# Patient Record
Sex: Male | Born: 1965 | Race: White | Hispanic: No | Marital: Married | Smoking: Former smoker
Health system: Southern US, Community
[De-identification: ages and names within clinical notes are randomized; demographics above are authoritative.]

## PROBLEM LIST (undated history)

## (undated) DIAGNOSIS — I10 Essential (primary) hypertension: Secondary | ICD-10-CM

## (undated) DIAGNOSIS — F419 Anxiety disorder, unspecified: Secondary | ICD-10-CM

## (undated) DIAGNOSIS — E785 Hyperlipidemia, unspecified: Secondary | ICD-10-CM

## (undated) HISTORY — DX: Essential (primary) hypertension: I10

## (undated) HISTORY — DX: Anxiety disorder, unspecified: F41.9

## (undated) HISTORY — DX: Hyperlipidemia, unspecified: E78.5

---
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
## 1997-12-08 HISTORY — PX: VASECTOMY: SHX75

## 2006-02-09 ENCOUNTER — Emergency Department (HOSPITAL_COMMUNITY): Admission: EM | Admit: 2006-02-09 | Discharge: 2006-02-09 | Payer: Self-pay | Admitting: Emergency Medicine

## 2011-11-12 ENCOUNTER — Ambulatory Visit: Payer: PRIVATE HEALTH INSURANCE

## 2011-11-13 ENCOUNTER — Ambulatory Visit (INDEPENDENT_AMBULATORY_CARE_PROVIDER_SITE_OTHER): Payer: PRIVATE HEALTH INSURANCE

## 2011-11-13 DIAGNOSIS — J00 Acute nasopharyngitis [common cold]: Secondary | ICD-10-CM

## 2011-11-13 DIAGNOSIS — J40 Bronchitis, not specified as acute or chronic: Secondary | ICD-10-CM

## 2011-11-13 DIAGNOSIS — H6980 Other specified disorders of Eustachian tube, unspecified ear: Secondary | ICD-10-CM

## 2011-11-13 DIAGNOSIS — J019 Acute sinusitis, unspecified: Secondary | ICD-10-CM

## 2018-06-22 ENCOUNTER — Ambulatory Visit: Payer: BLUE CROSS/BLUE SHIELD | Admitting: Family Medicine

## 2018-06-22 ENCOUNTER — Encounter: Payer: Self-pay | Admitting: Family Medicine

## 2018-06-22 VITALS — BP 154/108 | HR 92 | Ht 73.0 in | Wt 227.8 lb

## 2018-06-22 DIAGNOSIS — Z8042 Family history of malignant neoplasm of prostate: Secondary | ICD-10-CM

## 2018-06-22 DIAGNOSIS — Z833 Family history of diabetes mellitus: Secondary | ICD-10-CM | POA: Diagnosis not present

## 2018-06-22 DIAGNOSIS — Z811 Family history of alcohol abuse and dependence: Secondary | ICD-10-CM

## 2018-06-22 DIAGNOSIS — R030 Elevated blood-pressure reading, without diagnosis of hypertension: Secondary | ICD-10-CM | POA: Insufficient documentation

## 2018-06-22 DIAGNOSIS — Z83438 Family history of other disorder of lipoprotein metabolism and other lipidemia: Secondary | ICD-10-CM | POA: Diagnosis not present

## 2018-06-22 DIAGNOSIS — Z8249 Family history of ischemic heart disease and other diseases of the circulatory system: Secondary | ICD-10-CM | POA: Diagnosis not present

## 2018-06-22 DIAGNOSIS — F1010 Alcohol abuse, uncomplicated: Secondary | ICD-10-CM | POA: Diagnosis not present

## 2018-06-22 DIAGNOSIS — Z87891 Personal history of nicotine dependence: Secondary | ICD-10-CM | POA: Diagnosis not present

## 2018-06-22 NOTE — Progress Notes (Signed)
New patient office visit note:  Impression and Recommendations:    1. Elevated blood pressure reading   2. Excessive drinking of alcohol   3. History of smoking 25-50 pack years   4. Family history of diabetes mellitus   5. Family history of essential hypertension   6. Family history of prostate cancer in father   83. Family history of alcoholism   8. Family history of combined hyperlipidemia     1. Elevated Blood Pressure - Reviewed normal blood pressure with the patient today, 120/80.  - Lifestyle changes such as dash diet and engaging in a regular exercise program discussed with patient.  Reduce alcohol intake to help control blood pressure as well.  - Ambulatory BP monitoring encouraged. Keep log and bring in next OV.  Sit quietly for 15-20 minutes prior to measuring blood pressure, avoiding stimulants beforehand.  2. BMI Counseling Explained to patient what BMI refers to, and what it means medically.    Told patient to think about it as a "medical risk stratification measurement" and how increasing BMI is associated with increasing risk/ or worsening state of various diseases such as hypertension, hyperlipidemia, diabetes, premature OA, depression etc.  American Heart Association guidelines for healthy diet, basically Mediterranean diet, and exercise guidelines of 30 minutes 5 days per week or more discussed in detail.  Health counseling performed.  All questions answered.  3. Lifestyle & Preventative Health Maintenance - Advised patient to continue working toward exercising to improve overall mental, physical, and emotional health.    - Engage in daily physical activity, to goal of 5-10 minutes per day to start.  Recommended that the patient eventually strive for at least 150 minutes of moderate cardiovascular activity per week according to guidelines established by the [facility].   - Healthy dietary habits encouraged, including low-carb, and high amounts of lean protein in  diet.   - Patient should also consume adequate amounts of water - half of body weight in oz of water per day.   Education and routine counseling performed. Handouts provided.  4. Follow-Up - Will draw lab work at patient's earliest convenience.  Return for follow-up OV to discuss labs and blood pressure.  - Discussed the need for CPE in the future.  - Reviewed red flag symptoms with the patient.  If he feels chest pain, dizziness, dizziness on exertion, new onset visual changes, new onset headaches, chest tightness, chest tightness on exertion, or arm pain or jaw pain, he knows to immediately seek emergency medical care.   Orders Placed This Encounter  Procedures  . CBC with Differential/Platelet  . Comprehensive metabolic panel  . Hemoglobin A1c  . Lipid panel  . HIV antibody  . Hepatitis C antibody  . PSA, total and free  . T4, free  . TSH  . VITAMIN D 25 Hydroxy (Vit-D Deficiency, Fractures)  . Microalbumin / creatinine urine ratio    Gross side effects, risk and benefits, and alternatives of medications discussed with patient.  Patient is aware that all medications have potential side effects and we are unable to predict every side effect or drug-drug interaction that may occur.  Expresses verbal understanding and consents to current therapy plan and treatment regimen.  Return for Chronic OV w me near future & FBW 2-3d prior.  Please see AVS handed out to patient at the end of our visit for further patient instructions/ counseling done pertaining to today's office visit.    Note: This document was prepared  using Systems analyst and may include unintentional dictation errors.  This document serves as a record of services personally performed by [name], DO. It was created on her behalf by Toni Amend, a trained medical scribe. The creation of this record is based on the scribe's personal observations and the provider's statements to them.    I have reviewed the above medical documentation for accuracy and completeness and I concur.  [name] 06/23/18 6:55 PM    ----------------------------------------------------------------------------------------------------------------------    Subjective:    Chief complaint:   Chief Complaint  Patient presents with  . Establish Care    HPI: Patrick Avila is a pleasant 52 y.o. male who presents to [facility] today to review their medical history with me and establish care.   I asked the patient to review their chronic problem list with me to ensure everything was updated and accurate.    All recent office visits with other providers, any medical records that patient brought in etc  - I reviewed today.     We asked pt to get [place] their medical records from [facility] providers/ specialists that they had seen within the past 3-5 years- if they are in private practice and/or do not work for [employer], [facility], [place], [facility] or [employer] owned practice.  Told them to call their specialists to clarify this if they are not sure.   Reason for Establishing Care Patient states that he's never had a medical provider before.  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 He's turning 52 this year.  When he hit 20, he got motivated, dedicated, worked out, go Research officer, trade union, lasted for 3 to 3.5 years.  He would always give up on his birthday [birth year] until New Year's, and then resume.  Then one year it didn't happen.    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  Would like to get back in shape and maybe maintain it a few more years longer.  Went totally lazy over the winter; sat on the couch, didn't move, drank beer. Drinks Bud Light.  States that  his mom has been fussing at him to come in for five years.  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  Was formerly married to one person; now married to second wife, Gabriel Cirri.  Tobacco Use Former smoker. Smoked for 15 years, at a pack and a half per day at the worst. Has been smoking marijuana prior to and after.  Alcohol Use Drinks large amounts of beer per week; prefers Bud Light. Has been a fairly consistent heavier drinker for 6 years.  History of daily marijuana use; does not use it daily now.  Family History Father, maternal grandparents, and several uncles that abused alcohol.  Mom had kidney cancer.  Mother has been on antidepressants "forever."  Mom and Dad with HTN and DM.  Mom was bigger, had gastric bypass 10-15 years ago. Had diabetes at age 43, probably.  Dad didn't get his diabetes figured out until age 19.  Dad was a smoker; with COPD.  Dad was diagnosed with prostate cancer in low or mid 36's.  Has a half-sister on  ADHD medicine.  Past Medical History  Deals with a lot of chronic stress.  Patient expresses that he does not want an anxiety or mood disorder diagnosis on his record.  Has high blood pressure; was "yelled at by the lady at [facility]" last year to go get a primary care provider.  At home, runs high 130's, "sometimes down."  States sometimes he will be in the high 120's, every now and then it's really high. Lower number is always in the 90's.  Feeling Winded - Potentially due to Inactivity He is here today due to the fact that he wanted to get back into shape and had the goal of starting to work out again more often.  But since he was feeling so winded, "I didn't want my wife coming back home and finding me dead."  Denies chest pain.  Denies dizziness, denies new onset visual changes.  Denies headaches.  Denies chest tightness.  Denies chest tightness on exertion.   Denies arm pain or jaw pain.  Has started wearing readers in the past two years.  When he's drinking less, his vision is better; when he drinks more, his vision is worse.  Notes that he excessively sweats all the time.   Wt Readings from Last 3 Encounters:  06/22/18 227 lb 12.8 oz (103.3 kg)   BP Readings from Last 3 Encounters:  06/22/18 (!) 154/108   Pulse Readings from Last 3 Encounters:  06/22/18 92   BMI Readings from Last 3 Encounters:  06/22/18 30.05 kg/m    Patient Care Team    Relationship Specialty Notifications Start End  [name], DO PCP - General Family Medicine  05/12/18     Patient Active Problem List   Diagnosis Date Noted  . Elevated blood pressure reading 06/22/2018  . Excessive drinking of alcohol 06/22/2018  . History of smoking 25-50 pack years 06/22/2018  . Family history of diabetes mellitus 06/22/2018  . Family history of essential hypertension 06/22/2018  . Family history of prostate cancer in father 06/22/2018  . Family history of alcoholism 06/22/2018  . Family history of combined hyperlipidemia 06/22/2018     History reviewed. No pertinent past medical history.   History reviewed. No pertinent past medical history.      Family History  Problem Relation Age of Onset  . Kidney cancer Mother   . Diabetes Mother   . Hypertension Mother   . Alcoholism Father   . Prostate cancer Father   . Diabetes Father   . Hypertension Father   . COPD Father   . Alcoholism Maternal Grandmother   . Diabetes Maternal Grandmother   . Alcoholism Maternal Grandfather   . Diabetes Maternal Grandfather      Social History   Substance and Sexual Activity  Drug Use Not on file     Social History   Substance and Sexual Activity  Alcohol Use Yes  . Alcohol/week: 18.0 oz  . Types: 30 Standard drinks or equivalent per week     Social History   Tobacco Use  Smoking Status Former Smoker  . Packs/day: 1.50  . Years: 15.00  . Pack  years: 22.50  . Types: Cigarettes  . Last attempt to quit: 2006  . Years since quitting: 13.5  Smokeless Tobacco Current User  . Types: Chew  Tobacco Comment   still smokes cigars occ.      Current Meds  Medication Sig  . Cinnamon 500 MG capsule Take 1,000 mg by mouth daily.  . fluticasone (  FLONASE) 50 MCG/ACT nasal spray Place into both nostrils daily.  Marland Kitchen loratadine (KLS ALLERCLEAR) 10 MG tablet Take 10 mg by mouth daily.  . Milk Thistle 175 MG CAPS Take 1 capsule by mouth daily.  . Multiple Vitamin (ONE-A-DAY MENS PO) Take 1 tablet by mouth daily.    Allergies: Nylon   Review of Systems  Constitutional: Negative for chills, diaphoresis, fever, malaise/fatigue and weight loss.  HENT: Negative for congestion, sore throat and tinnitus.   Eyes: Negative for blurred vision, double vision and photophobia.  Respiratory: Negative for cough and wheezing.   Cardiovascular: Negative for chest pain and palpitations.  Gastrointestinal: Negative for blood in stool, diarrhea, nausea and vomiting.  Genitourinary: Negative for dysuria, frequency and urgency.  Musculoskeletal: Negative for joint pain and myalgias.  Skin: Negative for itching and rash.  Neurological: Negative for dizziness, focal weakness, weakness and headaches.  Endo/Heme/Allergies: Negative for environmental allergies and polydipsia. Does not bruise/bleed easily.  Psychiatric/Behavioral: Negative for depression and memory loss. The patient is not nervous/anxious and does not have insomnia.      Objective:   Blood pressure (!) 154/108, pulse 92, height 6\' 1"  (1.854 m), weight 227 lb 12.8 oz (103.3 kg), SpO2 98 %. Body mass index is 30.05 kg/m. General: Well Developed, well nourished, and in no acute distress.  Neuro: Alert and oriented x3, extra-ocular muscles intact, sensation grossly intact.  HEENT:[place]/AT, PERRLA, neck supple, No carotid bruits Skin: no gross rashes  Cardiac: Regular rate and rhythm Respiratory:  Essentially clear to auscultation bilaterally. Not using accessory muscles, speaking in full sentences.  Abdominal: not grossly distended Musculoskeletal: Ambulates w/o diff, FROM * 4 ext.  Vasc: less 2 sec cap RF, warm and pink  Psych:  No HI/SI, judgement and insight good, Euthymic mood. Full Affect.    No results found for this or any previous visit (from the past 2160 hour(s)).

## 2018-06-22 NOTE — Patient Instructions (Addendum)
Please Google search reasons for falsely elevated PSA levels.  Avoid these things before PSA is checked.    How to Increase Your Level of Physical Activity  Getting regular physical activity is important for your overall health and well-being. Most people do not get enough exercise. There are easy ways to increase your level of physical activity, even if you have not been very active in the past or you are just starting out. Why is physical activity important? Physical activity has many short-term and long-term health benefits. Regular exercise can:  Help you lose weight or maintain a healthy weight.  Strengthen your muscles and bones.  Boost your mood and improve self-esteem.  Reduce your risk of certain long-term (chronic) diseases, like heart disease, cancer, and diabetes.  Help you stay capable of walking and moving around (mobile) as you age.  Prevent accidents, such as falls, as you age.  Increase life expectancy.  What are the benefits of being physically active on a regular basis? In addition to improving your physical health, being physically active on most days of the week can help you in ways that you may not expect. Benefits of regular physical activity may include:  Feeling good about your body.  Being able to move around more easily and for longer periods of time without getting tired (increased stamina).  Finding new sources of fun and enjoyment.  Meeting new people who share a common interest.  Being able to fight off illness better (enhanced immunity).  Being able to sleep better.  What can happen if I am not physically active on a regular basis? Not getting enough physical activity can lead to an unhealthy lifestyle and future health problems. This can increase your chances of:  Becoming overweight or obese.  Becoming sick.  Developing chronic illnesses, like heart disease or diabetes.  Having mental health problems, like depression or  anxiety.  Having sleep problems.  Having trouble walking or getting yourself around (reduced mobility).  Injuring yourself in a fall as you get older.  What steps can I take to be more physically active?  Check with your health care provider about how to get started. Ask your health care provider what activities are safe for you.  Start out slowly. Walking or doing some simple chair exercises is a good place to start, especially if you have not been active before or for a long time.  Try to find activities that you enjoy. You are more likely to commit to an exercise routine if it does not feel like a chore.  If you have bone or joint problems, choose low-impact exercises, like walking or swimming.  Include physical activity in your everyday routine.  Invite friends or family members to exercise with you. This also will help you commit to your workout plan.  Set goals that you can work toward.  Aim for at least 150 minutes of moderate-intensity exercise each week. Examples of moderate-intensity exercise include walking or riding a bike. Where to find more information:  Centers for Disease Control and Prevention: BowlingGrip.is  [employer] on [address] [place] www.http://villegas.org/  ChooseMyPlate: WirelessMort[employer]ages.dk Contact a health care provider if:  You have headaches, muscle aches, or joint pain.  You feel dizzy or light-headed while exercising.  You faint.  You have chest pain while exercising. Summary  Exercise benefits your mind and body at any age, even if you are just starting out.  If you have a chronic illness or have not been active for  a while, check with your health care provider before increasing your physical activity.  Choose activities that are safe and enjoyable for you.Ask your health care provider what activities are safe for you.  Start slowly. Tell your health care provider  if you have problems as you start to increase your activity level. This information is not intended to replace advice given to you by your health care provider. Make sure you discuss any questions you have with your health care provider. Document Released: 11/13/2016 Document Revised: 11/13/2016 Document Reviewed: 11/13/2016 Elsevier Interactive Patient Education  2018 [employer].   Please realize, EXERCISE IS MEDICINE!  -  American Heart Association ( AHA) guidelines for exercise : If you are in good health, without any medical conditions, you should engage in 150 minutes of moderate intensity aerobic activity per week.  This means you should be huffing and puffing throughout your workout.   Engaging in regular exercise will improve brain function and memory, as well as improve mood, boost immune system and help with weight management.  As well as the other, more well-known effects of exercise such as decreasing blood sugar levels, decreasing blood pressure,  and decreasing bad cholesterol levels/ increasing good cholesterol levels.     -  The AHA strongly endorses consumption of a diet that contains a variety of foods from all the food categories with an emphasis on fruits and vegetables; fat-free and low-fat dairy products; cereal and grain products; legumes and nuts; and fish, poultry, and/or extra lean meats.    Excessive food intake, especially of foods high in saturated and trans fats, sugar, and salt, should be avoided.    Adequate water intake of roughly 1/2 of your weight in pounds, should equal the ounces of water per day you should drink.  So for instance, if you're 200 pounds, that would be 100 ounces of water per day.         Mediterranean Diet  Why follow it? Research shows  Those who follow the Mediterranean diet have a reduced risk of heart disease   The diet is associated with a reduced incidence of Parkinson's and Alzheimer's diseases  People following the diet may have  longer life expectancies and lower rates of chronic diseases   The Dietary Guidelines for Americans recommends the Mediterranean diet as an eating plan to promote health and prevent disease  What Is the Mediterranean Diet?   Healthy eating plan based on typical foods and recipes of Mediterranean-style cooking  The diet is primarily a plant based diet; these foods should make up a majority of meals   Starches - Plant based foods should make up a majority of meals - They are an important sources of vitamins, minerals, energy, antioxidants, and fiber - Choose whole grains, foods high in fiber and minimally processed items  - Typical grain sources include wheat, oats, barley, corn, brown rice, bulgar, farro, millet, polenta, couscous  - Various types of beans include chickpeas, lentils, fava beans, black beans, white beans   Fruits  Veggies - Large quantities of antioxidant rich fruits & veggies; 6 or more servings  - Vegetables can be eaten raw or lightly drizzled with oil and cooked  - Vegetables common to the traditional Mediterranean Diet include: artichokes, arugula, beets, broccoli, brussel sprouts, cabbage, carrots, celery, collard greens, cucumbers, eggplant, kale, leeks, lemons, lettuce, mushrooms, okra, onions, peas, peppers, potatoes, pumpkin, radishes, rutabaga, shallots, spinach, sweet potatoes, turnips, zucchini - Fruits common to the Mediterranean Diet include: apples, apricots, avocados,  cherries, clementines, dates, figs, grapefruits, grapes, melons, nectarines, oranges, peaches, pears, pomegranates, strawberries, tangerines  Fats - Replace butter and margarine with healthy oils, such as olive oil, canola oil, and tahini  - Limit nuts to no more than a handful a day  - Nuts include walnuts, almonds, pecans, pistachios, pine nuts  - Limit or avoid candied, honey roasted or heavily salted nuts - Olives are central to the Mediterranean diet - can be eaten whole or used in a variety  of dishes   Meats Protein - Limiting red meat: no more than a few times a month - When eating red meat: choose lean cuts and keep the portion to the size of deck of cards - Eggs: approx. 0 to 4 times a week  - Fish and lean poultry: at least 2 a week  - Healthy protein sources include, chicken, [place], lean beef, lamb - Increase intake of seafood such as tuna, salmon, trout, mackerel, shrimp, scallops - Avoid or limit high fat processed meats such as sausage and bacon  Dairy - Include moderate amounts of low fat dairy products  - Focus on healthy dairy such as fat free yogurt, skim milk, low or reduced fat cheese - Limit dairy products higher in fat such as whole or 2% milk, cheese, ice cream  Alcohol - Moderate amounts of red wine is ok  - No more than 5 oz daily for women (all ages) and men older than age 85  - No more than 10 oz of wine daily for men younger than 93  Other - Limit sweets and other desserts  - Use herbs and spices instead of salt to flavor foods  - Herbs and spices common to the traditional Mediterranean Diet include: basil, bay leaves, chives, cloves, cumin, fennel, garlic, lavender, marjoram, mint, oregano, parsley, pepper, rosemary, sage, savory, sumac, tarragon, thyme   Its not just a diet, its a lifestyle:   The Mediterranean diet includes lifestyle factors typical of those in the region   Foods, drinks and meals are best eaten with others and savored  Daily physical activity is important for overall good health  This could be strenuous exercise like running and aerobics  This could also be more leisurely activities such as walking, housework, yard-work, or taking the stairs  Moderation is the key; a balanced and healthy diet accommodates most foods and drinks  Consider portion sizes and frequency of consumption of certain foods   Meal Ideas & Options:   Breakfast:  o Whole wheat toast or whole wheat English muffins with peanut butter & hard boiled  egg o Steel cut oats topped with apples & cinnamon and skim milk  o Fresh fruit: banana, strawberries, melon, berries, peaches  o Smoothies: strawberries, bananas, greek yogurt, peanut butter o Low fat greek yogurt with blueberries and granola  o Egg white omelet with spinach and mushrooms o Breakfast couscous: whole wheat couscous, apricots, skim milk, cranberries   Sandwiches:  o Hummus and grilled vegetables (peppers, zucchini, squash) on whole wheat bread   o Grilled chicken on whole wheat pita with lettuce, tomatoes, cucumbers or tzatziki  o Tuna salad on whole wheat bread: tuna salad made with greek yogurt, olives, red peppers, capers, green onions o Garlic rosemary lamb pita: lamb sauted with garlic, rosemary, salt & pepper; add lettuce, cucumber, greek yogurt to pita - flavor with lemon juice and black pepper   Seafood:  o Mediterranean grilled salmon, seasoned with garlic, basil, parsley, lemon juice and black  pepper o Shrimp, lemon, and spinach whole-grain pasta salad made with low fat greek yogurt  o Seared scallops with lemon orzo  o Seared tuna steaks seasoned salt, pepper, coriander topped with tomato mixture of olives, tomatoes, olive oil, minced garlic, parsley, green onions and cappers   Meats:  o Herbed greek chicken salad with kalamata olives, cucumber, feta  o Red bell peppers stuffed with spinach, bulgur, lean ground beef (or lentils) & topped with feta   o Kebabs: skewers of chicken, tomatoes, onions, zucchini, squash  o [place] burgers: made with red onions, mint, dill, lemon juice, feta cheese topped with roasted red peppers  Vegetarian o Cucumber salad: cucumbers, artichoke hearts, celery, red onion, feta cheese, tossed in olive oil & lemon juice  o Hummus and whole grain pita points with a greek salad (lettuce, tomato, feta, olives, cucumbers, red onion) o Lentil soup with celery, carrots made with vegetable broth, garlic, salt and pepper  o Tabouli salad:  parsley, bulgur, mint, scallions, cucumbers, tomato, radishes, lemon juice, olive oil, salt and pepper.

## 2018-07-22 ENCOUNTER — Other Ambulatory Visit: Payer: BLUE CROSS/BLUE SHIELD

## 2018-07-22 DIAGNOSIS — Z8249 Family history of ischemic heart disease and other diseases of the circulatory system: Secondary | ICD-10-CM

## 2018-07-22 DIAGNOSIS — Z87891 Personal history of nicotine dependence: Secondary | ICD-10-CM

## 2018-07-22 DIAGNOSIS — Z833 Family history of diabetes mellitus: Secondary | ICD-10-CM

## 2018-07-22 DIAGNOSIS — R030 Elevated blood-pressure reading, without diagnosis of hypertension: Secondary | ICD-10-CM

## 2018-07-22 DIAGNOSIS — F1010 Alcohol abuse, uncomplicated: Secondary | ICD-10-CM

## 2018-07-22 DIAGNOSIS — Z8042 Family history of malignant neoplasm of prostate: Secondary | ICD-10-CM

## 2018-07-22 DIAGNOSIS — Z83438 Family history of other disorder of lipoprotein metabolism and other lipidemia: Secondary | ICD-10-CM

## 2018-07-23 LAB — COMPREHENSIVE METABOLIC PANEL
ALT: 95 IU/L — ABNORMAL HIGH (ref 0–44)
AST: 51 IU/L — ABNORMAL HIGH (ref 0–40)
Albumin/Globulin Ratio: 1.7 (ref 1.2–2.2)
Albumin: 4.5 g/dL (ref 3.5–5.5)
Alkaline Phosphatase: 86 IU/L (ref 39–117)
BUN/Creatinine Ratio: 11 (ref 9–20)
BUN: 11 mg/dL (ref 6–24)
Bilirubin Total: 0.5 mg/dL (ref 0.0–1.2)
CO2: 25 mmol/L (ref 20–29)
Calcium: 9.5 mg/dL (ref 8.7–10.2)
Chloride: 99 mmol/L (ref 96–106)
Creatinine, Ser: 0.96 mg/dL (ref 0.76–1.27)
GFR calc Af Amer: 107 mL/min/{1.73_m2} (ref >59)
GFR calc non Af Amer: 92 mL/min/{1.73_m2} (ref >59)
Globulin, Total: 2.6 g/dL (ref 1.5–4.5)
Glucose: 94 mg/dL (ref 65–99)
Potassium: 4.5 mmol/L (ref 3.5–5.2)
Sodium: 140 mmol/L (ref 134–144)
Total Protein: 7.1 g/dL (ref 6.0–8.5)

## 2018-07-23 LAB — TSH: TSH: 0.999 u[IU]/mL (ref 0.450–4.500)

## 2018-07-23 LAB — CBC WITH DIFFERENTIAL/PLATELET
Basophils Absolute: 0 10*3/uL (ref 0.0–0.2)
Basos: 1 %
EOS (ABSOLUTE): 0.2 10*3/uL (ref 0.0–0.4)
Eos: 2 %
Hematocrit: 47.2 % (ref 37.5–51.0)
Hemoglobin: 15.2 g/dL (ref 13.0–17.7)
Immature Grans (Abs): 0 10*3/uL (ref 0.0–0.1)
Immature Granulocytes: 0 %
Lymphocytes Absolute: 2.4 10*3/uL (ref 0.7–3.1)
Lymphs: 28 %
MCH: 28.5 pg (ref 26.6–33.0)
MCHC: 32.2 g/dL (ref 31.5–35.7)
MCV: 89 fL (ref 79–97)
Monocytes Absolute: 1.1 10*3/uL — ABNORMAL HIGH (ref 0.1–0.9)
Monocytes: 12 %
Neutrophils Absolute: 5.1 10*3/uL (ref 1.4–7.0)
Neutrophils: 57 %
Platelets: 269 10*3/uL (ref 150–450)
RBC: 5.33 x10E6/uL (ref 4.14–5.80)
RDW: 12.8 % (ref 12.3–15.4)
WBC: 8.8 10*3/uL (ref 3.4–10.8)

## 2018-07-23 LAB — MICROALBUMIN / CREATININE URINE RATIO
Creatinine, Urine: 225.6 mg/dL
Microalb/Creat Ratio: 6.7 mg/g creat (ref 0.0–30.0)
Microalbumin, Urine: 15.2 ug/mL

## 2018-07-23 LAB — LIPID PANEL
Chol/HDL Ratio: 5.8 ratio — ABNORMAL HIGH (ref 0.0–5.0)
Cholesterol, Total: 209 mg/dL — ABNORMAL HIGH (ref 100–199)
HDL: 36 mg/dL — ABNORMAL LOW (ref >39)
LDL Calculated: 132 mg/dL — ABNORMAL HIGH (ref 0–99)
Triglycerides: 204 mg/dL — ABNORMAL HIGH (ref 0–149)
VLDL Cholesterol Cal: 41 mg/dL — ABNORMAL HIGH (ref 5–40)

## 2018-07-23 LAB — PSA, TOTAL AND FREE
PSA, Free Pct: 17.8 %
PSA, Free: 0.16 ng/mL
Prostate Specific Ag, Serum: 0.9 ng/mL (ref 0.0–4.0)

## 2018-07-23 LAB — T4, FREE: Free T4: 1.17 ng/dL (ref 0.82–1.77)

## 2018-07-23 LAB — VITAMIN D 25 HYDROXY (VIT D DEFICIENCY, FRACTURES): Vit D, 25-Hydroxy: 37.4 ng/mL (ref 30.0–100.0)

## 2018-07-23 LAB — HEMOGLOBIN A1C
Est. average glucose Bld gHb Est-mCnc: 108 mg/dL
Hgb A1c MFr Bld: 5.4 % (ref 4.8–5.6)

## 2018-07-23 LAB — HIV ANTIBODY (ROUTINE TESTING W REFLEX): HIV Screen 4th Generation wRfx: NONREACTIVE

## 2018-07-23 LAB — HEPATITIS C ANTIBODY: Hep C Virus Ab: <0.1 s/co ratio (ref 0.0–0.9)

## 2018-07-26 ENCOUNTER — Ambulatory Visit: Payer: BLUE CROSS/BLUE SHIELD | Admitting: Family Medicine

## 2018-07-26 ENCOUNTER — Encounter: Payer: Self-pay | Admitting: Family Medicine

## 2018-07-26 VITALS — BP 165/102 | HR 99 | Ht 73.0 in | Wt 220.7 lb

## 2018-07-26 DIAGNOSIS — E782 Mixed hyperlipidemia: Secondary | ICD-10-CM

## 2018-07-26 DIAGNOSIS — Z23 Encounter for immunization: Secondary | ICD-10-CM | POA: Diagnosis not present

## 2018-07-26 DIAGNOSIS — I10 Essential (primary) hypertension: Secondary | ICD-10-CM | POA: Insufficient documentation

## 2018-07-26 DIAGNOSIS — E786 Lipoprotein deficiency: Secondary | ICD-10-CM | POA: Insufficient documentation

## 2018-07-26 DIAGNOSIS — E781 Pure hyperglyceridemia: Secondary | ICD-10-CM

## 2018-07-26 DIAGNOSIS — R748 Abnormal levels of other serum enzymes: Secondary | ICD-10-CM

## 2018-07-26 DIAGNOSIS — F1010 Alcohol abuse, uncomplicated: Secondary | ICD-10-CM

## 2018-07-26 DIAGNOSIS — Z87891 Personal history of nicotine dependence: Secondary | ICD-10-CM

## 2018-07-26 MED ORDER — ATORVASTATIN CALCIUM 80 MG PO TABS: 80.0000 mg | ORAL_TABLET | Freq: Every day | ORAL | 3 refills | Status: DC

## 2018-07-26 MED ORDER — LOSARTAN POTASSIUM 50 MG PO TABS: 50.0000 mg | ORAL_TABLET | Freq: Every day | ORAL | 3 refills | Status: DC

## 2018-07-26 NOTE — Patient Instructions (Addendum)
Also for the blood pressure medicine please start off at 1/2 tablet daily-this is losartan.  Continue to monitor your blood pressure on a daily basis and after a week to 2 weeks increase dose to 1 full tablet to a goal of less than 130/80 on a regular basis.  Furthermore, with the cholesterol medicine atorvastatin, please start off with a half a tablet nightly before bed.  Do this for a week or 2 and then increase to 1 full tablet nightly.  As we discussed every 3 to 4 days you go down by 1 alcoholic beverage a day.  If you become symptomatic at all with any withdrawal symptoms please do not hesitate to stay at the current beverage dose and wait 6 to 7 days if necessary.  As long as you continue to make progress this is what we care about.  Very important as we discussed you drink adequate amounts of water -about a gallon per day and try to walk daily.  Also very important you come in in 4 weeks for lab only visit and then follow-up with me in 6 weeks.  Please bring in your blood pressure log and please do not hesitate to reach out for me sooner if you have any questions or concerns about anything.  As we discussed, if you need any help with withdrawal symptoms, sleep or mood-please let me know.     Alcohol Withdrawal Alcohol withdrawal is a group of symptoms that can develop when a person who drinks heavily and regularly stops drinking or drinks less. What are the causes? Heavy and regular drinking can cause chemicals that send signals from the brain to the body (neurotransmitters) to deactivate. Alcohol withdrawal develops when deactivated neurotransmitters reactivate because a person stops drinking or drinks less. What increases the risk? The more a person drinks and the longer he or she drinks, the greater the risk of alcohol withdrawal. Severe withdrawal is more likely to develop in someone who:  Had severe alcohol withdrawal in the past.  Had a seizure during a previous episode of  alcohol withdrawal.  Is elderly.  Is pregnant.  Has been abusing drugs.  Has other medical problems, including: ? Infection. ? Heart, lung, or liver disease. ? Seizures. ? Mental health problems.  What are the signs or symptoms? Symptoms of this condition can be mild to moderate, or they can be severe. Mild to moderate symptoms may include:  Fatigue.  Nightmares.  Trouble sleeping.  Depression.  Anxiety.  Inability to think clearly.  Mood swings.  Irritability.  Loss of appetite.  Nausea or vomiting.  Clammy skin.  Extreme sweating.  Rapid heartbeat.  Shakiness.  Uncontrollable shaking (tremor).  Severe symptoms may include:  Fever.  Seizures.  Severeconfusion.  Feeling or seeing things that are not there (hallucinations).  Symptoms usually begin within eight hours after a person stops drinking or drinks less. They can last for weeks. How is this diagnosed? Alcohol withdrawal is diagnosed with a medical history and physical exam. Sometimes, urine and blood tests are also done. How is this treated? Treatment may involve:  Monitoring blood pressure, pulse, and breathing.  Getting fluids through an IV tube.  Medicine to reduce anxiety.  Medicine to prevent or control seizures.  Multivitamins and B vitamins.  Having a health care provider check on you daily.  If symptoms are moderate to severe or if there is a risk of severe withdrawal, treatment may be done at a hospital or treatment center. Follow these instructions  at home:  Take medicines and vitamin supplements only as directed by your health care provider.  Do not drink alcohol.  Have someone stay with you or be available if you need help.  Drink enough fluid to keep your urine clear or pale yellow.  Consider joining a 12-step program or another alcohol support group. Contact a health care provider if:  Your symptoms get worse or do not go away.  You cannot keep food or  water in your stomach.  You are struggling with not drinking alcohol.  You cannot stop drinking alcohol. Get help right away if:  You have an irregular heartbeat.  You have chest pain.  You have trouble breathing.  You have symptoms of severe withdrawal, such as: ? A fever. ? Seizures. ? Severe confusion. ? Hallucinations. This information is not intended to replace advice given to you by your health care provider. Make sure you discuss any questions you have with your health care provider. Document Released: 09/03/2005 Document Revised: 04/02/2016 Document Reviewed: 09/12/2014 Elsevier Interactive Patient Education  2018 [place] (Tetanus and Diphtheria): What You Need to Know 1. Why get vaccinated? Tetanus  and diphtheria are very serious diseases. They are rare in the [place] today, but people who do become infected often have severe complications. Td vaccine is used to protect adolescents and adults from both of these diseases. Both tetanus and diphtheria are infections caused by bacteria. Diphtheria spreads from person to person through coughing or sneezing. Tetanus-causing bacteria enter the body through cuts, scratches, or wounds. TETANUS (lockjaw) causes painful muscle tightening and stiffness, usually all over the body.  It can lead to tightening of muscles in the head and neck so you can't open your mouth, swallow, or sometimes even breathe. Tetanus kills about 1 out of every 10 people who are infected even after receiving the best medical care.  DIPHTHERIA can cause a thick coating to form in the back of the throat.  It can lead to breathing problems, paralysis, heart failure, and death.  Before vaccines, as many as 200,000 cases of diphtheria and hundreds of cases of tetanus were reported in the [place] each year. Since vaccination began, reports of cases for both diseases have dropped by about 99%. 2. Td vaccine Td vaccine can  protect adolescents and adults from tetanus and diphtheria. Td is usually given as a booster dose every 10 years but it can also be given earlier after a severe and dirty wound or burn. Another vaccine, called Tdap, which protects against pertussis in addition to tetanus and diphtheria, is sometimes recommended instead of Td vaccine. Your doctor or the person giving you the vaccine can give you more information. Td may safely be given at the same time as other vaccines. 3. Some people should not get this vaccine  A person who has ever had a life-threatening allergic reaction after a previous dose of any tetanus or diphtheria containing vaccine, OR has a severe allergy to any part of this vaccine, should not get Td vaccine. Tell the person giving the vaccine about any severe allergies.  Talk to your doctor if you: ? had severe pain or swelling after any vaccine containing diphtheria or tetanus, ? ever had a condition called Guillain Barre Syndrome (GBS), ? aren't feeling well on the day the shot is scheduled. 4. What are the risks from Td vaccine? With any medicine, including vaccines, there is a chance of side effects. These are usually  mild and go away on their own. Serious reactions are also possible but are rare. Most people who get Td vaccine do not have any problems with it. Mild problems following Td vaccine: (Did not interfere with activities)  Pain where the shot was given (about 8 people in 10)  Redness or swelling where the shot was given (about 1 person in 4)  Mild fever (rare)  Headache (about 1 person in 4)  Tiredness (about 1 person in 4)  Moderate problems following Td vaccine: (Interfered with activities, but did not require medical attention)  Fever over 102F (rare)  Severe problems following Td vaccine: (Unable to perform usual activities; required medical attention)  Swelling, severe pain, bleeding and/or redness in the arm where the shot was given  (rare).  Problems that could happen after any vaccine:  People sometimes faint after a medical procedure, including vaccination. Sitting or lying down for about 15 minutes can help prevent fainting, and injuries caused by a fall. Tell your doctor if you feel dizzy, or have vision changes or ringing in the ears.  Some people get severe pain in the shoulder and have difficulty moving the arm where a shot was given. This happens very rarely.  Any medication can cause a severe allergic reaction. Such reactions from a vaccine are very rare, estimated at fewer than 1 in a million doses, and would happen within a few minutes to a few hours after the vaccination. As with any medicine, there is a very remote chance of a vaccine causing a serious injury or death. The safety of vaccines is always being monitored. For more information, visit: http://www.aguilar.org/ 5. What if there is a serious reaction? What should I look for? Look for anything that concerns you, such as signs of a severe allergic reaction, very high fever, or unusual behavior. Signs of a severe allergic reaction can include hives, swelling of the face and throat, difficulty breathing, a fast heartbeat, dizziness, and weakness. These would usually start a few minutes to a few hours after the vaccination. What should I do?  If you think it is a severe allergic reaction or other emergency that can't wait, call 9-1-1 or get the person to the nearest hospital. Otherwise, call your doctor.  Afterward, the reaction should be reported to the Vaccine Adverse Event Reporting System (VAERS). Your doctor might file this report, or you can do it yourself through the VAERS web site at www.vaers.SamedayNews.es, or by calling 626-325-5417. ? VAERS does not give medical advice. 6. The National Vaccine Injury Compensation Program The [employer] Vaccine Injury Compensation Program (VICP) is a federal program that was created to compensate people who may have  been injured by certain vaccines. Persons who believe they may have been injured by a vaccine can learn about the program and about filing a claim by calling 618 502 1524 or visiting the [place] website at GoldCloset.com.ee. There is a time limit to file a claim for compensation. 7. How can I learn more?  Ask your doctor. He or she can give you the vaccine package insert or suggest other sources of information.  Call your local or state health department.  Contact the Centers for Disease Control and Prevention (CDC): ? Call (914)766-1796 (1-800-CDC-INFO) ? Visit CDC's website at http://hunter.com/ CDC Td Vaccine VIS (03/18/16) This information is not intended to replace advice given to you by your health care provider. Make sure you discuss any questions you have with your health care provider. Document Released: 09/21/2006 Document Revised: 08/14/2016 Document  Reviewed: 08/14/2016 Elsevier Interactive Patient Education  2017 [employer].

## 2018-07-26 NOTE — Progress Notes (Signed)
Impression and Recommendations:    1. Essential hypertension   2. Excessive drinking of alcohol   3. Elevated liver enzymes   4. Mixed hyperlipidemia   5. Hypertriglyceridemia   6. Low level of high density lipoprotein (HDL)   7. History of smoking 25-50 pack years   8. Need for Tdap vaccination     HTN - Poorly controlled -Started BP medication; see med list below -Educated pt about bp medications, SE, and red flag symptoms -Discussed goal bp of 120/80 or less -Discussed lifestyle affects of bp including stress, heavy drinking, diet and exercise -Discussed impact of HTN on kidneys, erectile function, and cardiovascular health -Return in 4-6 weeks to check progress of medication   Prostate Health -Discussed his N PSA level, causes of abnormal PSA levels, and importance of regular prostate checks -Encouraged pt to create a physical appointment in order to check prostate due to age   High normal serum Crt -Discussed importance of maintaining hydration, avoiding nephrotoxic OTC medications to maintaining kidney health -Instructed pt that he needs to drink roughly a gallon of water each day, and to increase water intake if he is drinking alcohol   Elevated liver enzymes -Discussed the importance of liver function to overall health and strongly encouraged pt to diminish daily alcohol intake -Discussed impact of cholesterol medications on overall liver function and importance of checking regularly -Educated pt on ways to decrease alcohol intake and ways to set goals for improvement   - CMA asked to place future labs   HLD: - Poorly controlled -Started cholesterol medication; see med list below -Discussed importance of ASCVD risk as an indicator of possible heart attack and stroke -ASCVD risk is currently 16.4%  -Educated pt on possibility of controlling cholesterol with diet and exercise after changing his lifestyle  -Discussed common SE of statin medications, ways  to avoid SE, and need for regular liver enzyme testing to monitor changes -Educated pt on impact of cholesterol medication on liver and need to decrease alcohol intake to protect liver function -Return in 4-6 weeks to check progress of medication   Alcohol Abuse -Set goal for reducing alcohol intake one drink every 3-4 days -Extensively discussed options for support in reducing alcohol intake, including counseling and Alcoholics Anonymous -Discussed the importance of slowly weaning off alcohol due to potential SE of physical dependency including shakiness, vomiting, and seizures -Educated pt of potential SE of withdrawal, what to expect if it occurs, and to taper intake longer if he begins having issues.  -Instructed the patient to contact us with any issues or need for mood support -Encouraged pt to switch out beer for seltzer water or another beverage during the times he normally has alcohol -Return in 4-6 weeks to check progress of alcohol elimination  Pt was in the office today for 32.5+ minutes, with over 50% time spent in face to face counseling of patients various medical conditions, treatment plans of those medical conditions including medicine management and lifestyle modification, strategies to improve health and well being; and in coordination of care. SEE ABOVE TREATMENT PLAN FOR DETAILS  Meds ordered this encounter  Medications  . losartan (COZAAR) 50 MG tablet    Sig: Take 1 tablet (50 mg total) by mouth daily.    Dispense:  90 tablet    Refill:  3  . atorvastatin (LIPITOR) 80 MG tablet    Sig: Take 1 tablet (80 mg total) by mouth at bedtime.    Dispense:  90  tablet    Refill:  3    Gross side effects, risk and benefits, and alternatives of medications and treatment plan in general discussed with patient.  Patient is aware that all medications have potential side effects and we are unable to predict every side effect or drug-drug interaction that may occur.   Patient will  call with any questions prior to using medication if they have concerns.  Expresses verbal understanding and consents to current therapy and treatment regimen.  No barriers to understanding were identified.  Red flag symptoms and signs discussed in detail.  Patient expressed understanding regarding what to do in case of emergency\urgent symptoms  Please see AVS handed out to patient at the end of our visit for further patient instructions/ counseling done pertaining to today's office visit.   Return for In 4 weeks come in for ALT and AST lab only visit; 6 weeks OV with me-new BP med and chol med.    Note:  This note was prepared with assistance of Dragon voice recognition software. Occasional wrong-word or sound-a-like substitutions may have occurred due to the inherent limitations of voice recognition software.  This document serves as a record of services personally performed by [name], MD. It was created on her behalf by Georga Bora, a trained medical scribe. The creation of this record is based on the scribe's personal observations and the provider's statements to them.   I have reviewed the above medical documentation for accuracy and completeness and I concur.  [name] 07/26/18 5:47 PM   --------------------------------------------------------------------------------------------------------------------------------------------------------------------------------------------------------------------------------------------    Subjective:     HPI: Patrick Avila is a 52 y.o. male who presents to [place] at [facility] today for issues as discussed below.  Alcohol Abuse -Pt states he recognizes he drinks too much alcohol and wants to discuss methods to completley quit drinking except for special occasions -Believes his drinking is causing erectile dysfunction  -States he has currently reduced drinking to 12 bud lights each weekend day, used to  "drink 3 times that amount each day on the weekend"  -Wants to decrease weekend drinking because he has noticed his shakiness on Monday mornings -Pt says he used to drink an 18 pack each day after work and has decreased his work week intake to only a 6 per day -States he has had issues sleeping and believes it is also due to the amount of alcohol he has been drinking -Tried amytriptyline in the past; states it "made me dumb as a rock" even at the lowest dosage and does not want to try the medication again -Pt's cousin had to be hospitalized due to alcoholism and seeing how strongly his cousin was addicted helped encourage him to quit -States he believes his relationship with his wife will improve with continuing to reduce intake  Lifestyle -PT lost 5 lbs with diet and exercise -Bought an elliptical and has been using it to break up the habit of sitting at home after work -Wants to begin exercising again and getting healthy  BP -BP readings at home: 130/85, 136/91, 128/75, 137/93, 119/76, 143/94, 129/76, 134/88 -States he and his wife have been taking their bp readings together  The 10-year ASCVD risk score Mikey Bussing DC Jr., et al., 2013) is: 19.1%   Values used to calculate the score:     Age: 24 years     Sex: Male     Is Non-Hispanic African American: No     Diabetic: No  Tobacco smoker: Yes     Systolic Blood Pressure: 299 mmHg     Is BP treated: Yes     HDL Cholesterol: 36 mg/dL     Total Cholesterol: 209 mg/dL  Family History -Mother history of depression; prescribed amytriptyline and tolerated well  Lab results 07/22/18 CBC -WBC WNL -Hematocrit WNL -Platelets WNL Kidneys Creatinine/ser at 0.96, WNL but near upper limit -Sodium, Calcium and Potassium WNL Vitamin D -low at 37.4  Thyroid -TSH at 0.999 -T4 at 1.17 -Both WNL PSA -PSA at 0.9   Liver -AST 51; above normal -ALT 95; above normal  Lipid Panel -LDL at 132 -HDL at 36 -Triglycerides at  204        Wt Readings from Last 3 Encounters:  07/26/18 220 lb 11.2 oz (100.1 kg)  06/22/18 227 lb 12.8 oz (103.3 kg)   BP Readings from Last 3 Encounters:  07/26/18 (!) 165/102  06/22/18 (!) 154/108   Pulse Readings from Last 3 Encounters:  07/26/18 99  06/22/18 92   BMI Readings from Last 3 Encounters:  07/26/18 29.12 kg/m  06/22/18 30.05 kg/m     Patient Care Team    Relationship Specialty Notifications Start End  [name], DO PCP - General Family Medicine  05/12/18      Patient Active Problem List   Diagnosis Date Noted  . Essential hypertension 07/26/2018  . Hypertriglyceridemia 07/26/2018  . Mixed hyperlipidemia 07/26/2018  . Low level of high density lipoprotein (HDL) 07/26/2018  . Elevated blood pressure reading 06/22/2018  . Excessive drinking of alcohol 06/22/2018  . History of smoking 25-50 pack years 06/22/2018  . Family history of diabetes mellitus 06/22/2018  . Family history of essential hypertension 06/22/2018  . Family history of prostate cancer in father 06/22/2018  . Family history of alcoholism 06/22/2018  . Family history of combined hyperlipidemia 06/22/2018    Past Medical history, Surgical history, Family history, Social history, Allergies and Medications have been entered into the medical record, reviewed and changed as needed.    Current Meds  Medication Sig  . Cinnamon 500 MG capsule Take 1,000 mg by mouth daily.  . fluticasone (FLONASE) 50 MCG/ACT nasal spray Place into both nostrils daily.  Marland Kitchen loratadine (KLS ALLERCLEAR) 10 MG tablet Take 10 mg by mouth daily.  . Milk Thistle 175 MG CAPS Take 1 capsule by mouth daily.  . Multiple Vitamin (ONE-A-DAY MENS PO) Take 1 tablet by mouth daily.    Allergies:  Allergies  Allergen Reactions  . Nylon Rash    sutures     Review of Systems:  A fourteen system review of systems was performed and found to be positive as per HPI.   Objective:   Blood pressure (!) 165/102,  pulse 99, height 6\' 1"  (1.854 m), weight 220 lb 11.2 oz (100.1 kg), SpO2 98 %. Body mass index is 29.12 kg/m. General:  Well Developed, well nourished, appropriate for stated age.  Neuro:  Alert and oriented,  extra-ocular muscles intact  HEENT:  Normocephalic, atraumatic, neck supple, no carotid bruits appreciated  Skin:  no gross rash, warm, pink. Cardiac:  RRR, S1 S2 Respiratory:  ECTA B/L and A/P, Not using accessory muscles, speaking in full sentences- unlabored. Vascular:  Ext warm, no cyanosis apprec.; cap RF less 2 sec. Psych:  No HI/SI, judgement and insight good, Euthymic mood. Full Affect.

## 2018-08-26 ENCOUNTER — Other Ambulatory Visit (INDEPENDENT_AMBULATORY_CARE_PROVIDER_SITE_OTHER): Payer: BLUE CROSS/BLUE SHIELD

## 2018-08-26 DIAGNOSIS — E782 Mixed hyperlipidemia: Secondary | ICD-10-CM

## 2018-08-27 LAB — ALT: ALT: 53 IU/L — ABNORMAL HIGH (ref 0–44)

## 2018-08-29 DIAGNOSIS — R748 Abnormal levels of other serum enzymes: Secondary | ICD-10-CM | POA: Insufficient documentation

## 2018-09-16 ENCOUNTER — Ambulatory Visit: Payer: BLUE CROSS/BLUE SHIELD | Admitting: Family Medicine

## 2018-09-16 ENCOUNTER — Encounter: Payer: Self-pay | Admitting: Family Medicine

## 2018-09-16 VITALS — BP 125/85 | HR 79 | Ht 73.0 in | Wt 219.5 lb

## 2018-09-16 DIAGNOSIS — H539 Unspecified visual disturbance: Secondary | ICD-10-CM

## 2018-09-16 DIAGNOSIS — E781 Pure hyperglyceridemia: Secondary | ICD-10-CM

## 2018-09-16 DIAGNOSIS — H5461 Unqualified visual loss, right eye, normal vision left eye: Secondary | ICD-10-CM

## 2018-09-16 DIAGNOSIS — I10 Essential (primary) hypertension: Secondary | ICD-10-CM | POA: Diagnosis not present

## 2018-09-16 DIAGNOSIS — R748 Abnormal levels of other serum enzymes: Secondary | ICD-10-CM

## 2018-09-16 DIAGNOSIS — Z811 Family history of alcohol abuse and dependence: Secondary | ICD-10-CM

## 2018-09-16 DIAGNOSIS — E782 Mixed hyperlipidemia: Secondary | ICD-10-CM

## 2018-09-16 DIAGNOSIS — F1010 Alcohol abuse, uncomplicated: Secondary | ICD-10-CM | POA: Diagnosis not present

## 2018-09-16 DIAGNOSIS — M722 Plantar fascial fibromatosis: Secondary | ICD-10-CM

## 2018-09-16 DIAGNOSIS — E786 Lipoprotein deficiency: Secondary | ICD-10-CM

## 2018-09-16 DIAGNOSIS — F4323 Adjustment disorder with mixed anxiety and depressed mood: Secondary | ICD-10-CM | POA: Insufficient documentation

## 2018-09-16 MED ORDER — ATORVASTATIN CALCIUM 80 MG PO TABS: 40.0000 mg | ORAL_TABLET | Freq: Every day | ORAL | 3 refills | Status: DC

## 2018-09-16 MED ORDER — BUSPIRONE HCL 10 MG PO TABS: 5.0000 mg | ORAL_TABLET | Freq: Three times a day (TID) | ORAL | 1 refills | Status: DC

## 2018-09-16 MED ORDER — FLUOXETINE HCL 20 MG PO CAPS: 20.0000 mg | ORAL_CAPSULE | Freq: Every day | ORAL | 1 refills | Status: DC

## 2018-09-16 NOTE — Patient Instructions (Addendum)
We are getting you to see an ophthalmologist.  I put in a urgent referral meeting I want you to be seen by one today or tomorrow.  Please call the office first thing in the morning [id number] and speak with Dorothea Ogle if you have not heard.   Please hold off on the Prozac and buspirone until you been on half of the dose of Lipitor for 2 to 4 weeks and you no longer have any eye symptoms and or have been evaluated by I doctor.  Also when you do started please just start the Prozac first and take that for a week then if tolerated well then start the BuSpar as written.  You can go up to take 1 full tablet of the BuSpar 3 times daily as well.  I will see you back in 6 to 8 weeks  -Also please remember the red flag symptoms we discussed.  No heavy lifting, or exertion until you are evaluated by the ophthalmologist.  Please make sure they send [place] these records.  Also, if you get symptoms that do not go away this is an emergency and you are to proceed urgently to the ER or call 911

## 2018-09-16 NOTE — Progress Notes (Signed)
Impression and Recommendations:    1. Essential hypertension   2. Excessive drinking of alcohol   3. Hypertriglyceridemia   4. Mixed hyperlipidemia   5. Low level of high density lipoprotein (HDL)   6. Family history of alcoholism   7. Elevated liver enzymes   8. Adjustment disorder with mixed anxiety and depressed mood   9. Plantar fasciitis, left   10. Vision loss of right eye   11. Vision changes     1. Visual Concerns After Starting Losartan & Lipitor - Urgent referral placed to Ophthalmology today.  - STRONGLY ADVISED patient to attend appointment with eye doctor as soon as possible to have his visual symptoms evaluated.  - Advised patient no strenuous activity or exertion (sex, lifting weights) until seen by the eye doctor.  - If these symptoms occur without exertion, or occur and do not resolve, advised patient to go to the Emergency Room immediately.  - Discussed red flag symptoms with patient and patient understands to treat these symptoms as an emergency situation.  2. Hypertriglyceridemia, Mixed Hyperlipidemia, Low level of HDL - Decrease to half dose of Lipitor - from 80 down to 40 mg. - Evaluate whether the Lipitor is causing his visual S-E.  - Send message to clinic in a couple of weeks letting us know if S-E is resolved.  3. Essential Hypertension - BP controlled at this time. - Continue treatment plan as prescribed.  See med list below. - Patient tolerating meds well without complication.  Denies S-E  - Lifestyle changes such as dash diet and engaging in a regular exercise program discussed with patient.  Educational handouts provided  - Ambulatory BP monitoring encouraged. Keep log and bring in next OV.  4. Excessive drinking of alcohol, Family history of alcoholism - Continue to avoid drinking alcohol.  5. Mood - Adjustment Disorder - Discussed need to begin treatment plan after visual symptoms are resolved. - Treatment plan discussed and  prescribed.  See med list today.  - Prescriptions provided but advised patient not to begin prescriptions until visual sx are resolved.  6. Elevated liver enzymes - Three weeks ago, reduced to 53 from 95 one month ago.  7. BMI Counseling Explained to patient what BMI refers to, and what it means medically.    Told patient to think about it as a "medical risk stratification measurement" and how increasing BMI is associated with increasing risk/ or worsening state of various diseases such as hypertension, hyperlipidemia, diabetes, premature OA, depression etc.  American Heart Association guidelines for healthy diet, basically Mediterranean diet, and exercise guidelines of 30 minutes 5 days per week or more discussed in detail.  Health counseling performed.  All questions answered.  8. Lifestyle & Preventative Health Maintenance - Advised patient to continue working toward exercising to improve overall mental, physical, and emotional health.    - Encouraged patient to engage in daily physical activity, especially a formal exercise routine.  Recommended that the patient eventually strive for at least 150 minutes of moderate cardiovascular activity per week according to guidelines established by the [facility]   - Healthy dietary habits encouraged, including low-carb, and high amounts of lean protein in diet.   - Patient should also consume adequate amounts of water.    Education and routine counseling performed. Handouts provided.  9. Follow-Up - Prescriptions refilled today PRN. - Re-check fasting lab work as recommended. - Otherwise, continue to return for CPE and chronic follow-up as scheduled.   - Patient  knows to call in if desired to address acute concerns PRN.  Pt was in the office today for 32.5+ minutes, with over 50% time spent in face to face counseling of patients various medical conditions, treatment plans of those medical conditions including medicine management and lifestyle  modification, strategies to improve health and well being; and in coordination of care. SEE ABOVE TREATMENT PLAN FOR DETAILS   Orders Placed This Encounter  Procedures  . Ambulatory referral to Ophthalmology    Meds ordered this encounter  Medications  . FLUoxetine (PROZAC) 20 MG capsule    Sig: Take 1 capsule (20 mg total) by mouth daily.    Dispense:  90 capsule    Refill:  1  . busPIRone (BUSPAR) 10 MG tablet    Sig: Take 0.5 tablets (5 mg total) by mouth 3 (three) times daily.    Dispense:  90 tablet    Refill:  1  . atorvastatin (LIPITOR) 80 MG tablet    Sig: Take 0.5 tablets (40 mg total) by mouth at bedtime.    Dispense:  90 tablet    Refill:  3    Medications Discontinued During This Encounter  Medication Reason  . atorvastatin (LIPITOR) 80 MG tablet      The patient was counseled, risk factors were discussed, anticipatory guidance given.  Gross side effects, risk and benefits, and alternatives of medications discussed with patient.  Patient is aware that all medications have potential side effects and we are unable to predict every side effect or drug-drug interaction that may occur.  Expresses verbal understanding and consents to current therapy plan and treatment regimen.  Return for 6-8 weeks-half Lipitor, started Prozac and BuSpar.  Please see AVS handed out to patient at the end of our visit for further patient instructions/ counseling done pertaining to today's office visit.    Note:  This document was prepared using Dragon voice recognition software and may include unintentional dictation errors.  This document serves as a record of services personally performed by [name], DO. It was created on her behalf by Toni Amend, a trained medical scribe. The creation of this record is based on the scribe's personal observations and the provider's statements to them.   I have reviewed the above medical documentation for accuracy and completeness and I  concur.  [name] 09/17/18 11:11 AM     Subjective:    HPI: Patrick Avila is a 52 y.o. male who presents to [place] at [facility] today for follow up of [place].    Visual Concerns After Starting Losartan & Lipitor Patient notes that when he exercises, about 5-10 minutes after starting exercise, he gets a "blur kaleidoscope feeling" in his right eye, and then over the next 4-5 minutes it "circles the whole eye but the inside gets clear."  The edge is shimmery while the center clears.  Notes that this occurs with strenuous exercise, specifically noting lifting weights "in my normal reps."  After finishing exercise/ending his exertion, patient notes that it takes about 20 minutes for his sx to clear up.  States that this symptom has only been occurring since he started his new cholesterol and blood pressure medications.  He started his cholesterol and blood pressure medications at the same time.  This symptom first occurred when he was doing his regular exercise routine.  After this, when he went to work out, he tried to "go easier" and notes that the visual symptom did not occur.  When he returned to his "regular level of workout," the visual symptoms returned and have occurred with exertion ever since.  This symptom did not occur when he was on half pills of medication.  Feels that this symptom began within about two weeks of starting his medication.  States that his mother has an eye problem where she goes to "get needles in her eyes."  Visits [facility].  Lifestyle Habits States that he's drinking more water lately. Feels more consistent about his hydration at work.  Has had a bit of stress related to work.  Has been experiencing plantar fasciitis in the heel of his foot, especially after long days walking at work.  Alcohol Drinking Has been avoiding drinking alcohol for two weeks.  Feels that when he drinks alcohol, it's easier to  speak with clients. Also notes that his "empathy is down" while talking to clients.  Feels that without alcohol, he is "not as jovial as he was."  Hypertension: -  His blood pressure has been controlled at home.  Pt has been checking it regularly.  123/83 120/78 125/82 112/72 122/78 130/77 120/70 119/80  - Patient reports good compliance with blood pressure medications.  - Denies medication S-E   - Smoking Status noted   - He denies new onset of: chest pain, exercise intolerance, shortness of breath, dizziness, visual changes, headache, lower extremity swelling or claudication.   Last 3 blood pressure readings in our office are as follows: BP Readings from Last 3 Encounters:  09/16/18 125/85  07/26/18 (!) 165/102  06/22/18 (!) 154/108    Pulse Readings from Last 3 Encounters:  09/16/18 79  07/26/18 99  06/22/18 92    Filed Weights   09/16/18 1517  Weight: 219 lb 8 oz (99.6 kg)   1. 52 y.o. male here for cholesterol follow-up.   - Patient reports good compliance with medications or treatment plan.  - Denies medication S-E   - Smoking Status noted   - He denies new onset of: chest pain, exercise intolerance, shortness of breath, dizziness, visual changes, headache, lower extremity swelling or claudication.   Denies myalgias.  The cholesterol last visit was:  Lab Results  Component Value Date   CHOL 209 (H) 07/22/2018   HDL 36 (L) 07/22/2018   LDLCALC 132 (H) 07/22/2018   TRIG 204 (H) 07/22/2018   CHOLHDL 5.8 (H) 07/22/2018    Hepatic Function Latest Ref Rng & Units 08/26/2018 07/22/2018  Total Protein 6.0 - 8.5 g/dL - 7.1  Albumin 3.5 - 5.5 g/dL - 4.5  AST 0 - 40 IU/L - 51(H)  ALT 0 - 44 IU/L 53(H) 95(H)  Alk Phosphatase 39 - 117 IU/L - 86  Total Bilirubin 0.0 - 1.2 mg/dL - 0.5     Patient Care Team    Relationship Specialty Notifications Start End  [name], DO PCP - General Family Medicine  05/12/18      Lab Results  Component Value  Date   CREATININE 0.96 07/22/2018   BUN 11 07/22/2018   NA 140 07/22/2018   K 4.5 07/22/2018   CL 99 07/22/2018   CO2 25 07/22/2018    Lab Results  Component Value Date   CHOL 209 (H) 07/22/2018    Lab Results  Component Value Date   HDL 36 (L) 07/22/2018    Lab Results  Component Value Date   LDLCALC 132 (H) 07/22/2018    Lab Results  Component Value Date   TRIG 204 (H) 07/22/2018  Lab Results  Component Value Date   CHOLHDL 5.8 (H) 07/22/2018    No results found for: LDLDIRECT ===================================================================   Patient Active Problem List   Diagnosis Date Noted  . Adjustment disorder with mixed anxiety and depressed mood 09/16/2018  . Plantar fasciitis, left 09/16/2018  . Elevated liver enzymes 08/29/2018  . Essential hypertension 07/26/2018  . Hypertriglyceridemia 07/26/2018  . Mixed hyperlipidemia 07/26/2018  . Low level of high density lipoprotein (HDL) 07/26/2018  . Elevated blood pressure reading 06/22/2018  . Excessive drinking of alcohol 06/22/2018  . History of smoking 25-50 pack years 06/22/2018  . Family history of diabetes mellitus 06/22/2018  . Family history of essential hypertension 06/22/2018  . Family history of prostate cancer in father 06/22/2018  . Family history of alcoholism 06/22/2018  . Family history of combined hyperlipidemia 06/22/2018     History reviewed. No pertinent past medical history.   Past Surgical History:  Procedure Laterality Date  . TONSILLECTOMY  1975  . VASECTOMY  1999     Family History  Problem Relation Age of Onset  . Kidney cancer Mother   . Diabetes Mother   . Hypertension Mother   . Alcoholism Father   . Prostate cancer Father   . Diabetes Father   . Hypertension Father   . COPD Father   . Alcoholism Maternal Grandmother   . Diabetes Maternal Grandmother   . Alcoholism Maternal Grandfather   . Diabetes Maternal Grandfather      Social History    Substance and Sexual Activity  Drug Use Not on file  ,  Social History   Substance and Sexual Activity  Alcohol Use Yes  . Alcohol/week: 30.0 standard drinks  . Types: 30 Standard drinks or equivalent per week  ,  Social History   Tobacco Use  Smoking Status Former Smoker  . Packs/day: 1.50  . Years: 15.00  . Pack years: 22.50  . Types: Cigarettes  . Last attempt to quit: 2006  . Years since quitting: 13.7  Smokeless Tobacco Current User  . Types: Chew  Tobacco Comment   still smokes cigars occ.   ,    Current Outpatient Medications on File Prior to Visit  Medication Sig Dispense Refill  . Cinnamon 500 MG capsule Take 1,000 mg by mouth daily.    . fluticasone (FLONASE) 50 MCG/ACT nasal spray Place into both nostrils daily.    Marland Kitchen loratadine (KLS ALLERCLEAR) 10 MG tablet Take 10 mg by mouth daily.    Marland Kitchen losartan (COZAAR) 50 MG tablet Take 1 tablet (50 mg total) by mouth daily. 90 tablet 3  . Milk Thistle 175 MG CAPS Take 1 capsule by mouth daily.    . Multiple Vitamin (ONE-A-DAY MENS PO) Take 1 tablet by mouth daily.     No current facility-administered medications on file prior to visit.      Allergies  Allergen Reactions  . Nylon Rash    sutures     Review of Systems:   General:  Denies fever, chills Optho/Auditory:   Denies visual changes, blurred vision Respiratory:   Denies SOB, cough, wheeze, DIB  Cardiovascular:   Denies chest pain, palpitations, painful respirations Gastrointestinal:   Denies nausea, vomiting, diarrhea.  Endocrine:     Denies new hot or cold intolerance Musculoskeletal:  Denies joint swelling, gait issues, or new unexplained myalgias/ arthralgias Skin:  Denies rash, suspicious lesions  Neurological:    Denies dizziness, unexplained weakness, numbness  Psychiatric/Behavioral:   Denies mood changes  Objective:  Blood pressure 125/85, pulse 79, height _0  (1.854 m), weight 219 lb 8 oz (99.6 kg), SpO2 98 %.  Body mass index is  28.96 kg/m.  General: Well Developed, well nourished, and in no acute distress.  HEENT: Normocephalic, atraumatic, pupils equal round reactive to light, neck supple, No carotid bruits, no JVD Skin: Warm and dry, cap RF less 2 sec Cardiac: Regular rate and rhythm, S1, S2 WNL's, no murmurs rubs or gallops Respiratory: ECTA B/L, Not using accessory muscles, speaking in full sentences. NeuroM-Sk: Ambulates w/o assistance, moves ext * 4 w/o difficulty, sensation grossly intact.  Ext: scant edema b/l lower ext Psych: No HI/SI, judgement and insight good, Euthymic mood. Full Affect.

## 2018-11-03 ENCOUNTER — Encounter: Payer: Self-pay | Admitting: Family Medicine

## 2018-11-03 ENCOUNTER — Ambulatory Visit: Payer: BLUE CROSS/BLUE SHIELD | Admitting: Family Medicine

## 2018-11-03 VITALS — BP 138/85 | HR 84 | Temp 98.6°F | Ht 73.0 in | Wt 220.0 lb

## 2018-11-03 DIAGNOSIS — I10 Essential (primary) hypertension: Secondary | ICD-10-CM

## 2018-11-03 DIAGNOSIS — E782 Mixed hyperlipidemia: Secondary | ICD-10-CM | POA: Diagnosis not present

## 2018-11-03 DIAGNOSIS — Z23 Encounter for immunization: Secondary | ICD-10-CM

## 2018-11-03 DIAGNOSIS — E781 Pure hyperglyceridemia: Secondary | ICD-10-CM

## 2018-11-03 DIAGNOSIS — R748 Abnormal levels of other serum enzymes: Secondary | ICD-10-CM

## 2018-11-03 DIAGNOSIS — F4323 Adjustment disorder with mixed anxiety and depressed mood: Secondary | ICD-10-CM | POA: Diagnosis not present

## 2018-11-03 DIAGNOSIS — F1010 Alcohol abuse, uncomplicated: Secondary | ICD-10-CM

## 2018-11-03 MED ORDER — ATORVASTATIN CALCIUM 80 MG PO TABS: 40.0000 mg | ORAL_TABLET | Freq: Every day | ORAL | 3 refills | Status: DC

## 2018-11-03 MED ORDER — FLUOXETINE HCL 10 MG PO TABS: ORAL_TABLET | ORAL | 3 refills | Status: DC

## 2018-11-03 NOTE — Patient Instructions (Addendum)
I initially wrote for the Lipitor on 07/26/2018 so 4 months from there would be towards the end of December.  You can come in sometime around then or early January for recheck of labs and office visit with me couple days later    [place] [employer] so proud to you!!!      If you have insomnia or difficulty sleeping, this information is for you:  - Avoid caffeinated beverages after lunch,  no alcoholic beverages,  no eating within 2-3 hours of lying down,  avoid exposure to blue light before bed,  avoid daytime naps, and  needs to maintain a regular sleep schedule- go to sleep and wake up around the same time every night.   - Resolve concerns or worries before entering bedroom:  Discussed relaxation techniques with patient and to keep a journal to write down fears\ worries.  I suggested seeing a counselor for CBT.   - Recommend patient meditate or do deep breathing exercises to help relax.   Incorporate the use of white noise machines or listen to "sleep meditation music", or recordings of guided meditations for sleep from YouTube which are free, such as  "guided meditation for detachment from over thinking"  by Mayford Knife.

## 2018-11-03 NOTE — Progress Notes (Signed)
Impression and Recommendations:    1. Adjustment disorder   2. Essential hypertension   3. Mixed hyperlipidemia   4. Hypertriglyceridemia   5. h/o Excessive drinking of alcohol   6. Elevated liver enzymes   7. Flu vaccine need     1. Mood - h/o excessive drinking of alcohol - Stable at this time. - Reduce Prozac from 20 mg to 10 mg tablet. - Continue Buspar as prescribed.  See med list today. - Patient tolerating meds well without complication.  Denies S-E  - As patient continues, he may eventually be managed only on Buspar due to sexual side-effects.  - Reviewed the "spokes of the wheel" of mood and health management.  Stressed the importance of ongoing prudent habits, including regular exercise, appropriate sleep hygiene, healthful dietary habits, and prayer/meditation to relax.  - Patient knows to send [place] a message through [facility] regarding his progress on mood meds.  2. Periodic Insomnia - Sleep is not optimal at this time. - Search sleep meditation on YouTube  3. Hypertension - Stable at this time.  - Continue treatment plan as prescribed.  See med list below. - Patient tolerating meds well without complication.  Denies S-E  - Lifestyle changes such as dash diet and engaging in a regular exercise program discussed with patient.  Educational handouts provided  - Ambulatory BP monitoring encouraged. Keep log and bring in next OV.  4. Hyperlipidemia - Continue on statin management as prescribed.  See med list below. - Patient tolerating statins well without complication.  Denies S-E.  - Continue with exercise and prudent lifestyle habits as recommended.  5. BMI Counseling Explained to patient what BMI refers to, and what it means medically.    Told patient to think about it as a "medical risk stratification measurement" and how increasing BMI is associated with increasing risk/ or worsening state of various diseases such as hypertension, hyperlipidemia,  diabetes, premature OA, depression etc.  American Heart Association guidelines for healthy diet, basically Mediterranean diet, and exercise guidelines of 30 minutes 5 days per week or more discussed in detail.  Health counseling performed.  All questions answered.  6. Lifestyle & Preventative Health Maintenance - Advised patient to continue working toward exercising to improve overall mental, physical, and emotional health.    - Encouraged patient to engage in daily physical activity, especially a formal exercise routine.  Recommended that the patient eventually strive for at least 150 minutes of moderate cardiovascular activity per week according to guidelines established by the [facility].   - Healthy dietary habits encouraged, including low-carb, and high amounts of lean protein in diet.   - Patient should also consume adequate amounts of water.   Education and routine counseling performed. Handouts provided.  Orders Placed This Encounter  Procedures  . Flu Vaccine QUAD 6+ mos PF IM (Fluarix Quad PF)  . Lipid panel  . AST  . ALT    Medications Discontinued During This Encounter  Medication Reason  . Milk Thistle 175 MG CAPS   . FLUoxetine (PROZAC) 20 MG capsule Dose change  . atorvastatin (LIPITOR) 80 MG tablet      Meds ordered this encounter  Medications  . atorvastatin (LIPITOR) 80 MG tablet    Sig: Take 0.5 tablets (40 mg total) by mouth at bedtime.    Dispense:  90 tablet    Refill:  3  . FLUoxetine (PROZAC) 10 MG tablet    Sig: Use one half tablet daily for 1  week then increase to 1 tablet daily    Dispense:  90 tablet    Refill:  3    Gross side effects, risk and benefits, and alternatives of medications and treatment plan in general discussed with patient.  Patient is aware that all medications have potential side effects and we are unable to predict every side effect or drug-drug interaction that may occur.   Patient will call with any questions prior to using  medication if they have concerns.  Expresses verbal understanding and consents to current therapy and treatment regimen.  No barriers to understanding were identified.  Red flag symptoms and signs discussed in detail.  Patient expressed understanding regarding what to do in case of emergency\urgent symptoms  Please see AVS handed out to patient at the end of our visit for further patient instructions/ counseling done pertaining to today's office visit.   Return photo needed, for end Dec/ early Jan FBW, then ov 3-4 d later.     Note:  This document was prepared using Dragon voice recognition software and may include unintentional dictation errors.   This document serves as a record of services personally performed by [name], DO. It was created on her behalf by Toni Amend, a trained medical scribe. The creation of this record is based on the scribe's personal observations and the provider's statements to them.   I have reviewed the above medical documentation for accuracy and completeness and I concur.  [name], DO 11/03/2018 4:25 PM      ------------------------------------------------------------------------------------------------------------------------    Subjective:    CC:  Chief Complaint  Patient presents with  . Follow-up    HPI: Patrick Avila is a 52 y.o. male who presents to [place] at [facility] today for follow-up of mood.   Feels his eye concerns as described last visit are "kicked off" by high stress.  Blood pressure remains around 117/75 at home.  Sometimes if he's squatting down and stands up suddenly, he "has to be careful about that."  Mood - Stress at Work & Anxiety Started his medications a week after last visit, about six weeks ago.  Says he definitely feels mellowed on the medications.  States he thinks the Prozac "might be much," too much.  Says the reason he states this is because "that's the one thing  that I felt a difference when I took it."  Felt a difference in "outlook."  Feeling "more reserved."  Feeling not as anxious, in spite of stress that's going on the last 6-8 weeks.  "I've managed through that extremely well, if not maybe 'too well.'"  Says he is not feeling amorous at all.  He has not felt any difference on the Buspar.  States that he forgets to take it at lunch time.  Minimum, he takes it at breakfast and at night.  Continued Sobriety He continues to avoid drinking alcohol and has been sober since the end of September (about two months).  Insomnia Experiences stress due to work that sometimes keeps him awake.  Hyperlipidemia Drinking water, doing "pretty good" with Lipitor.  "Haven't noticed anything worse [in terms of myalgias."  Slacked on his workouts the past couple of weeks after his eye concerns.  Works out 30-45 minutes per day, three times per week.  1. 52 y.o. male here for cholesterol follow-up.   - Patient reports good compliance with medications or treatment plan  - Denies medication S-E   - Smoking Status noted   -  He denies new onset of: chest pain, exercise intolerance, shortness of breath, dizziness, visual changes, headache, lower extremity swelling or claudication.   Denies myalgias.  The cholesterol last visit was:  Lab Results  Component Value Date   CHOL 209 (H) 07/22/2018   HDL 36 (L) 07/22/2018   LDLCALC 132 (H) 07/22/2018   TRIG 204 (H) 07/22/2018   CHOLHDL 5.8 (H) 07/22/2018    Hepatic Function Latest Ref Rng & Units 08/26/2018 07/22/2018  Total Protein 6.0 - 8.5 g/dL - 7.1  Albumin 3.5 - 5.5 g/dL - 4.5  AST 0 - 40 IU/L - 51(H)  ALT 0 - 44 IU/L 53(H) 95(H)  Alk Phosphatase 39 - 117 IU/L - 86  Total Bilirubin 0.0 - 1.2 mg/dL - 0.5     Depression screen [facility] 2/9 11/03/2018 09/16/2018 07/26/2018  Decreased Interest 0 0 0  Down, Depressed, Hopeless 0 0 0  PHQ - 2 Score 0 0 0  Altered sleeping 0 0 0  Tired, decreased energy 0 0 0   Change in appetite 0 0 0  Feeling bad or failure about yourself  0 0 0  Trouble concentrating 0 0 0  Moving slowly or fidgety/restless 0 0 0  Suicidal thoughts 0 0 0  PHQ-9 Score 0 0 0  Difficult doing work/chores Not difficult at all Not difficult at all Not difficult at all     GAD 7 : Generalized Anxiety Score 11/03/2018  Nervous, Anxious, on Edge 0  Control/stop worrying 1  Worry too much - different things 1  Trouble relaxing 0  Restless 0  Easily annoyed or irritable 0  Afraid - awful might happen 0  Total GAD 7 Score 2     Wt Readings from Last 3 Encounters:  11/03/18 220 lb (99.8 kg)  09/16/18 219 lb 8 oz (99.6 kg)  07/26/18 220 lb 11.2 oz (100.1 kg)   BP Readings from Last 3 Encounters:  11/03/18 138/85  09/16/18 125/85  07/26/18 (!) 165/102   Pulse Readings from Last 3 Encounters:  11/03/18 84  09/16/18 79  07/26/18 99   BMI Readings from Last 3 Encounters:  11/03/18 29.03 kg/m  09/16/18 28.96 kg/m  07/26/18 29.12 kg/m         Patient Care Team    Relationship Specialty Notifications Start End  [name], DO PCP - General Family Medicine  05/12/18      Patient Active Problem List   Diagnosis Date Noted  . Adjustment disorder with mixed anxiety and depressed mood 09/16/2018  . Plantar fasciitis, left 09/16/2018  . Elevated liver enzymes 08/29/2018  . Essential hypertension 07/26/2018  . Hypertriglyceridemia 07/26/2018  . Mixed hyperlipidemia 07/26/2018  . Low level of high density lipoprotein (HDL) 07/26/2018  . Elevated blood pressure reading 06/22/2018  . h/o Excessive drinking of alcohol 06/22/2018  . History of smoking 25-50 pack years 06/22/2018  . Family history of diabetes mellitus 06/22/2018  . Family history of essential hypertension 06/22/2018  . Family history of prostate cancer in father 06/22/2018  . Family history of alcoholism 06/22/2018  . Family history of combined hyperlipidemia 06/22/2018    Past Medical  history, Surgical history, Family history, Social history, Allergies and Medications have been entered into the medical record, reviewed and changed as needed.    Current Meds  Medication Sig  . atorvastatin (LIPITOR) 80 MG tablet Take 0.5 tablets (40 mg total) by mouth at bedtime.  . busPIRone (BUSPAR) 10 MG tablet Take 0.5 tablets (5 mg  total) by mouth 3 (three) times daily.  . Cinnamon 500 MG capsule Take 1,000 mg by mouth daily.  . fluticasone (FLONASE) 50 MCG/ACT nasal spray Place into both nostrils daily.  Marland Kitchen loratadine (KLS ALLERCLEAR) 10 MG tablet Take 10 mg by mouth daily.  Marland Kitchen losartan (COZAAR) 50 MG tablet Take 1 tablet (50 mg total) by mouth daily.  . Multiple Vitamin (ONE-A-DAY MENS PO) Take 1 tablet by mouth daily.  . [DISCONTINUED] atorvastatin (LIPITOR) 80 MG tablet Take 0.5 tablets (40 mg total) by mouth at bedtime.  . [DISCONTINUED] FLUoxetine (PROZAC) 20 MG capsule Take 1 capsule (20 mg total) by mouth daily.    Allergies:  Allergies  Allergen Reactions  . Nylon Rash    sutures     Review of Systems: Review of Systems: General:   No F/C, wt loss Pulm:   No DIB, SOB, pleuritic chest pain Card:  No CP, palpitations Abd:  No n/v/d or pain Ext:  No inc edema from baseline Psych: no SI/ HI    Objective:   Blood pressure 138/85, pulse 84, temperature 98.6 F (37 C), height 6' 1"  (1.854 m), weight 220 lb (99.8 kg), SpO2 99 %. Body mass index is 29.03 kg/m. General:  Well Developed, well nourished, appropriate for stated age.  Neuro:  Alert and oriented,  extra-ocular muscles intact  HEENT:  Normocephalic, atraumatic, neck supple, no carotid bruits appreciated  Skin:  no gross rash, warm, pink. Cardiac:  RRR, S1 S2 Respiratory:  ECTA B/L and A/P, Not using accessory muscles, speaking in full sentences- unlabored. Vascular:  Ext warm, no cyanosis apprec.; cap RF less 2 sec. Psych:  No HI/SI, judgement and insight good, Euthymic mood. Full Affect.

## 2018-12-14 ENCOUNTER — Other Ambulatory Visit (INDEPENDENT_AMBULATORY_CARE_PROVIDER_SITE_OTHER): Payer: BLUE CROSS/BLUE SHIELD

## 2018-12-14 DIAGNOSIS — E781 Pure hyperglyceridemia: Secondary | ICD-10-CM

## 2018-12-14 DIAGNOSIS — E782 Mixed hyperlipidemia: Secondary | ICD-10-CM

## 2018-12-14 DIAGNOSIS — R748 Abnormal levels of other serum enzymes: Secondary | ICD-10-CM

## 2018-12-14 DIAGNOSIS — F1010 Alcohol abuse, uncomplicated: Secondary | ICD-10-CM

## 2018-12-15 LAB — LIPID PANEL
Chol/HDL Ratio: 3.9 ratio (ref 0.0–5.0)
Cholesterol, Total: 133 mg/dL (ref 100–199)
HDL: 34 mg/dL — ABNORMAL LOW (ref >39)
LDL Calculated: 62 mg/dL (ref 0–99)
Triglycerides: 184 mg/dL — ABNORMAL HIGH (ref 0–149)
VLDL Cholesterol Cal: 37 mg/dL (ref 5–40)

## 2018-12-15 LAB — AST: AST: 34 IU/L (ref 0–40)

## 2018-12-15 LAB — ALT: ALT: 61 IU/L — ABNORMAL HIGH (ref 0–44)

## 2018-12-16 ENCOUNTER — Encounter: Payer: Self-pay | Admitting: Family Medicine

## 2018-12-16 ENCOUNTER — Ambulatory Visit: Payer: BLUE CROSS/BLUE SHIELD | Admitting: Family Medicine

## 2018-12-16 VITALS — BP 124/84 | HR 88 | Temp 98.3°F | Ht 73.0 in | Wt 230.0 lb

## 2018-12-16 DIAGNOSIS — R748 Abnormal levels of other serum enzymes: Secondary | ICD-10-CM

## 2018-12-16 DIAGNOSIS — E782 Mixed hyperlipidemia: Secondary | ICD-10-CM | POA: Diagnosis not present

## 2018-12-16 DIAGNOSIS — F4323 Adjustment disorder with mixed anxiety and depressed mood: Secondary | ICD-10-CM

## 2018-12-16 DIAGNOSIS — I10 Essential (primary) hypertension: Secondary | ICD-10-CM

## 2018-12-16 DIAGNOSIS — E781 Pure hyperglyceridemia: Secondary | ICD-10-CM | POA: Diagnosis not present

## 2018-12-16 DIAGNOSIS — E786 Lipoprotein deficiency: Secondary | ICD-10-CM

## 2018-12-16 DIAGNOSIS — F1010 Alcohol abuse, uncomplicated: Secondary | ICD-10-CM

## 2018-12-16 MED ORDER — ATORVASTATIN CALCIUM 20 MG PO TABS: 20.0000 mg | ORAL_TABLET | Freq: Every day | ORAL | 3 refills | Status: DC

## 2018-12-16 NOTE — Patient Instructions (Signed)
Great job Psychologist, clinical!!!   Keep up the good work!  Please realize, EXERCISE IS MEDICINE!  -  American Heart Association [facility]) guidelines for exercise : If you are in good health, without any medical conditions, you should engage in 150-300 minutes of moderate intensity aerobic activity per week.  This means you should be huffing and puffing throughout your workout.   Engaging in regular exercise will improve brain function and memory, as well as improve mood, boost immune system and help with weight management.  As well as the other, more well-known effects of exercise such as decreasing blood sugar levels, decreasing blood pressure,  and decreasing bad cholesterol levels/ increasing good cholesterol levels.     -  The AHA strongly endorses consumption of a diet that contains a variety of foods from all the food categories with an emphasis on fruits and vegetables; fat-free and low-fat dairy products; cereal and grain products; legumes and nuts; and fish, poultry, and/or extra lean meats.    Excessive food intake, especially of foods high in saturated and trans fats, sugar, and salt, should be avoided.    Adequate water intake of roughly 1/2 of your weight in pounds, should equal the ounces of water per day you should drink.  So for instance, if you're 200 pounds, that would be 100 ounces of water per day.         Mediterranean Diet  Why follow it? Research shows. . Those who follow the Mediterranean diet have a reduced risk of heart disease  . The diet is associated with a reduced incidence of Parkinson's and Alzheimer's diseases . People following the diet may have longer life expectancies and lower rates of chronic diseases  . The Dietary Guidelines for Americans recommends the Mediterranean diet as an eating plan to promote health and prevent disease  What Is the Mediterranean Diet?  . Healthy eating plan based on typical foods and recipes of Mediterranean-style cooking . The diet is primarily a  plant based diet; these foods should make up a majority of meals   Starches - Plant based foods should make up a majority of meals - They are an important sources of vitamins, minerals, energy, antioxidants, and fiber - Choose whole grains, foods high in fiber and minimally processed items  - Typical grain sources include wheat, oats, barley, corn, brown rice, bulgar, farro, millet, polenta, couscous  - Various types of beans include chickpeas, lentils, fava beans, black beans, white beans   Fruits  Veggies - Large quantities of antioxidant rich fruits & veggies; 6 or more servings  - Vegetables can be eaten raw or lightly drizzled with oil and cooked  - Vegetables common to the traditional Mediterranean Diet include: artichokes, arugula, beets, broccoli, brussel sprouts, cabbage, carrots, celery, collard greens, cucumbers, eggplant, kale, leeks, lemons, lettuce, mushrooms, okra, onions, peas, peppers, potatoes, pumpkin, radishes, rutabaga, shallots, spinach, sweet potatoes, turnips, zucchini - Fruits common to the Mediterranean Diet include: apples, apricots, avocados, cherries, clementines, dates, figs, grapefruits, grapes, melons, nectarines, oranges, peaches, pears, pomegranates, strawberries, tangerines  Fats - Replace butter and margarine with healthy oils, such as olive oil, canola oil, and tahini  - Limit nuts to no more than a handful a day  - Nuts include walnuts, almonds, pecans, pistachios, pine nuts  - Limit or avoid candied, honey roasted or heavily salted nuts - Olives are central to the Mediterranean diet - can be eaten whole or used in a variety of dishes   Meats Protein -  Limiting red meat: no more than a few times a month - When eating red meat: choose lean cuts and keep the portion to the size of deck of cards - Eggs: approx. 0 to 4 times a week  - Fish and lean poultry: at least 2 a week  - Healthy protein sources include, chicken, [place], lean beef, lamb - Increase intake  of seafood such as tuna, salmon, trout, mackerel, shrimp, scallops - Avoid or limit high fat processed meats such as sausage and bacon  Dairy - Include moderate amounts of low fat dairy products  - Focus on healthy dairy such as fat free yogurt, skim milk, low or reduced fat cheese - Limit dairy products higher in fat such as whole or 2% milk, cheese, ice cream  Alcohol - Moderate amounts of red wine is ok  - No more than 5 oz daily for women (all ages) and men older than age 43  - No more than 10 oz of wine daily for men younger than 28  Other - Limit sweets and other desserts  - Use herbs and spices instead of salt to flavor foods  - Herbs and spices common to the traditional Mediterranean Diet include: basil, bay leaves, chives, cloves, cumin, fennel, garlic, lavender, marjoram, mint, oregano, parsley, pepper, rosemary, sage, savory, sumac, tarragon, thyme   It's not just a diet, it's a lifestyle:  . The Mediterranean diet includes lifestyle factors typical of those in the region  . Foods, drinks and meals are best eaten with others and savored . Daily physical activity is important for overall good health . This could be strenuous exercise like running and aerobics . This could also be more leisurely activities such as walking, housework, yard-work, or taking the stairs . Moderation is the key; a balanced and healthy diet accommodates most foods and drinks . Consider portion sizes and frequency of consumption of certain foods   Meal Ideas & Options:  . Breakfast:  o Whole wheat toast or whole wheat English muffins with peanut butter & hard boiled egg o Steel cut oats topped with apples & cinnamon and skim milk  o Fresh fruit: banana, strawberries, melon, berries, peaches  o Smoothies: strawberries, bananas, greek yogurt, peanut butter o Low fat greek yogurt with blueberries and granola  o Egg white omelet with spinach and mushrooms o Breakfast couscous: whole wheat couscous,  apricots, skim milk, cranberries  . Sandwiches:  o Hummus and grilled vegetables (peppers, zucchini, squash) on whole wheat bread   o Grilled chicken on whole wheat pita with lettuce, tomatoes, cucumbers or tzatziki  o Tuna salad on whole wheat bread: tuna salad made with greek yogurt, olives, red peppers, capers, green onions o Garlic rosemary lamb pita: lamb sauted with garlic, rosemary, salt & pepper; add lettuce, cucumber, greek yogurt to pita - flavor with lemon juice and black pepper  . Seafood:  o Mediterranean grilled salmon, seasoned with garlic, basil, parsley, lemon juice and black pepper o Shrimp, lemon, and spinach whole-grain pasta salad made with low fat greek yogurt  o Seared scallops with lemon orzo  o Seared tuna steaks seasoned salt, pepper, coriander topped with tomato mixture of olives, tomatoes, olive oil, minced garlic, parsley, green onions and cappers  . Meats:  o Herbed greek chicken salad with kalamata olives, cucumber, feta  o Red bell peppers stuffed with spinach, bulgur, lean ground beef (or lentils) & topped with feta   o Kebabs: skewers of chicken, tomatoes, onions, zucchini, squash  o [place] burgers: made with red onions, mint, dill, lemon juice, feta cheese topped with roasted red peppers . Vegetarian o Cucumber salad: cucumbers, artichoke hearts, celery, red onion, feta cheese, tossed in olive oil & lemon juice  o Hummus and whole grain pita points with a greek salad (lettuce, tomato, feta, olives, cucumbers, red onion) o Lentil soup with celery, carrots made with vegetable broth, garlic, salt and pepper  o Tabouli salad: parsley, bulgur, mint, scallions, cucumbers, tomato, radishes, lemon juice, olive oil, salt and pepper.

## 2018-12-16 NOTE — Progress Notes (Signed)
Impression and Recommendations:    1. Essential hypertension   2. Adjustment disorder with mixed anxiety and depressed mood   3. Mixed hyperlipidemia   4. Hypertriglyceridemia   5. Low level of high density lipoprotein (HDL)   6. h/o Excessive drinking of alcohol   7. Elevated liver enzymes      Essential hypertension -Well controlled, continue on losartan  Adjustment disorder with mixed anxiety and depressed mood -Improved -He will continue to taper off prozac -Continue on buspar  Mixed hyperlipidemia -Prescribe Lipitor 20 mg, decreased from 40 mg -Well controlled, labs showed improvement -Recheck in 4 months  Hypertriglyceridemia -Patient will try krill oil -Recommended increase in exercise  Low level of high density lipoprotein (HDL) -Discussed ways to increase HDL  h/o Excessive drinking of alcohol -Patient did not drink over the holidays  Elevated liver enzymes -AST at 34, down from 51 -recheck ALT in 4 months   Orders Placed This Encounter  Procedures  . Lipid panel  . ALT    Meds ordered this encounter  Medications  . atorvastatin (LIPITOR) 20 MG tablet    Sig: Take 1 tablet (20 mg total) by mouth at bedtime.    Dispense:  90 tablet    Refill:  3    Medications Discontinued During This Encounter  Medication Reason  . FLUoxetine (PROZAC) 10 MG tablet   . atorvastatin (LIPITOR) 80 MG tablet Change in therapy     Gross side effects, risk and benefits, and alternatives of medications and treatment plan in general discussed with patient.  Patient is aware that all medications have potential side effects and we are unable to predict every side effect or drug-drug interaction that may occur.   Patient will call with any questions prior to using medication if they have concerns.    Expresses verbal understanding and consents to current therapy and treatment regimen.  No barriers to understanding were identified.  Red flag symptoms and signs  discussed in detail.  Patient expressed understanding regarding what to do in case of emergency\urgent symptoms  Please see AVS handed out to patient at the end of our visit for further patient instructions/ counseling done pertaining to today's office visit.   Return for f/up 36mo- with FBW 3-5d prior.     Note:  This note was prepared with assistance of Dragon voice recognition software. Occasional wrong-word or sound-a-like substitutions may have occurred due to the inherent limitations of voice recognition software.   This document serves as a record of services personally performed by [name], DO. It was created on her behalf by Wilburn Mylar, a trained medical scribe. The creation of this record is based on the scribe's personal observations and the provider's statements to them.   I have reviewed the above medical documentation for accuracy and completeness and I concur.  [name], DO 12/18/2018 3:31 PM        --------------------------------------------------------------------------------------------------------------------------------------------------------------------------------------------------------------------------------------------    Subjective:     HPI: Patrick Avila is a 53 y.o. male who presents to [place] at [facility] today for issues as discussed below.   Mood disorder -Can think more clearly after lowering prozac but was thinking more at night -Would like to go off completely -stay on buspar   HTN -blood pressures at home have been running: 120/71, 120/70, 116/76, 122/79, 114/69, 124/77   HLD -started lipitor 6 months ago -gained 10 lbs since 11/03/2018 -improved: total at 133, down from 209 -he will  return to exercising since the holidays have ended  Liver -took tylenol PM a couple times for insomnia -didn't drink alcohol over the holidays   Wt Readings from Last 3 Encounters:  12/16/18 230 lb  (104.3 kg)  11/03/18 220 lb (99.8 kg)  09/16/18 219 lb 8 oz (99.6 kg)   BP Readings from Last 3 Encounters:  12/16/18 124/84  11/03/18 138/85  09/16/18 125/85   Pulse Readings from Last 3 Encounters:  12/16/18 88  11/03/18 84  09/16/18 79   BMI Readings from Last 3 Encounters:  12/16/18 30.34 kg/m  11/03/18 29.03 kg/m  09/16/18 28.96 kg/m     Patient Care Team    Relationship Specialty Notifications Start End  [name], DO PCP - General Family Medicine  05/12/18      Patient Active Problem List   Diagnosis Date Noted  . Adjustment disorder with mixed anxiety and depressed mood 09/16/2018  . Plantar fasciitis, left 09/16/2018  . Elevated liver enzymes 08/29/2018  . Essential hypertension 07/26/2018  . Hypertriglyceridemia 07/26/2018  . Mixed hyperlipidemia 07/26/2018  . Low level of high density lipoprotein (HDL) 07/26/2018  . Elevated blood pressure reading 06/22/2018  . h/o Excessive drinking of alcohol 06/22/2018  . History of smoking 25-50 pack years 06/22/2018  . Family history of diabetes mellitus 06/22/2018  . Family history of essential hypertension 06/22/2018  . Family history of prostate cancer in father 06/22/2018  . Family history of alcoholism 06/22/2018  . Family history of combined hyperlipidemia 06/22/2018    Past Medical history, Surgical history, Family history, Social history, Allergies and Medications have been entered into the medical record, reviewed and changed as needed.    Current Meds  Medication Sig  . busPIRone (BUSPAR) 10 MG tablet Take 0.5 tablets (5 mg total) by mouth 3 (three) times daily.  . Cinnamon 500 MG capsule Take 1,000 mg by mouth daily.  . fluticasone (FLONASE) 50 MCG/ACT nasal spray Place into both nostrils daily.  Marland Kitchen loratadine (KLS ALLERCLEAR) 10 MG tablet Take 10 mg by mouth daily.  Marland Kitchen losartan (COZAAR) 50 MG tablet Take 1 tablet (50 mg total) by mouth daily.  . Multiple Vitamin (ONE-A-DAY MENS PO) Take 1  tablet by mouth daily.  . [DISCONTINUED] atorvastatin (LIPITOR) 80 MG tablet Take 0.5 tablets (40 mg total) by mouth at bedtime.  . [DISCONTINUED] FLUoxetine (PROZAC) 10 MG tablet Use one half tablet daily for 1 week then increase to 1 tablet daily (Patient taking differently: Take 10 mg by mouth daily. Use one half tablet daily for 1 week then increase to 1 tablet daily)    Allergies:  Allergies  Allergen Reactions  . Nylon Rash    sutures     Review of Systems:  A fourteen system review of systems was performed and found to be positive as per HPI.   Objective:   Blood pressure 124/84, pulse 88, temperature 98.3 F (36.8 C), height 6\' 1"  (1.854 m), weight 230 lb (104.3 kg), SpO2 98 %. Body mass index is 30.34 kg/m. General:  Well Developed, well nourished, appropriate for stated age.  Neuro:  Alert and oriented,  extra-ocular muscles intact  HEENT:  Normocephalic, atraumatic, neck supple, no carotid bruits appreciated  Skin:  no gross rash, warm, pink. Cardiac:  RRR, S1 S2 Respiratory:  ECTA B/L and A/P, Not using accessory muscles, speaking in full sentences- unlabored. Vascular:  Ext warm, no cyanosis apprec.; cap RF less 2 sec. Psych:  No HI/SI, judgement and insight good, Euthymic  mood. Full Affect.

## 2019-01-15 ENCOUNTER — Other Ambulatory Visit: Payer: Self-pay | Admitting: Family Medicine

## 2019-04-14 ENCOUNTER — Other Ambulatory Visit: Payer: BLUE CROSS/BLUE SHIELD

## 2019-04-21 ENCOUNTER — Ambulatory Visit: Payer: BLUE CROSS/BLUE SHIELD | Admitting: Family Medicine

## 2019-06-10 ENCOUNTER — Other Ambulatory Visit: Payer: Self-pay | Admitting: Family Medicine

## 2019-07-05 ENCOUNTER — Other Ambulatory Visit: Payer: Self-pay

## 2019-07-05 ENCOUNTER — Other Ambulatory Visit: Payer: BC Managed Care – PPO

## 2019-07-05 DIAGNOSIS — E781 Pure hyperglyceridemia: Secondary | ICD-10-CM

## 2019-07-05 DIAGNOSIS — E786 Lipoprotein deficiency: Secondary | ICD-10-CM

## 2019-07-05 DIAGNOSIS — E782 Mixed hyperlipidemia: Secondary | ICD-10-CM

## 2019-07-05 DIAGNOSIS — R748 Abnormal levels of other serum enzymes: Secondary | ICD-10-CM

## 2019-07-06 LAB — LIPID PANEL
Chol/HDL Ratio: 4.2 ratio (ref 0.0–5.0)
Cholesterol, Total: 129 mg/dL (ref 100–199)
HDL: 31 mg/dL — ABNORMAL LOW (ref >39)
LDL Calculated: 67 mg/dL (ref 0–99)
Triglycerides: 157 mg/dL — ABNORMAL HIGH (ref 0–149)
VLDL Cholesterol Cal: 31 mg/dL (ref 5–40)

## 2019-07-07 ENCOUNTER — Ambulatory Visit (INDEPENDENT_AMBULATORY_CARE_PROVIDER_SITE_OTHER): Payer: BC Managed Care – PPO | Admitting: Family Medicine

## 2019-07-07 ENCOUNTER — Other Ambulatory Visit: Payer: Self-pay

## 2019-07-07 ENCOUNTER — Encounter: Payer: Self-pay | Admitting: Family Medicine

## 2019-07-07 VITALS — BP 130/78 | Ht 73.0 in | Wt 240.0 lb

## 2019-07-07 DIAGNOSIS — E782 Mixed hyperlipidemia: Secondary | ICD-10-CM

## 2019-07-07 DIAGNOSIS — I10 Essential (primary) hypertension: Secondary | ICD-10-CM

## 2019-07-07 DIAGNOSIS — E786 Lipoprotein deficiency: Secondary | ICD-10-CM

## 2019-07-07 DIAGNOSIS — R748 Abnormal levels of other serum enzymes: Secondary | ICD-10-CM

## 2019-07-07 DIAGNOSIS — J3089 Other allergic rhinitis: Secondary | ICD-10-CM

## 2019-07-07 DIAGNOSIS — E781 Pure hyperglyceridemia: Secondary | ICD-10-CM | POA: Diagnosis not present

## 2019-07-07 DIAGNOSIS — F4323 Adjustment disorder with mixed anxiety and depressed mood: Secondary | ICD-10-CM

## 2019-07-07 DIAGNOSIS — F1010 Alcohol abuse, uncomplicated: Secondary | ICD-10-CM

## 2019-07-07 MED ORDER — FLUTICASONE PROPIONATE 50 MCG/ACT NA SUSP: NASAL | 2 refills | Status: DC

## 2019-07-07 NOTE — Progress Notes (Signed)
Virtual / live video office visit note for [employer], D.O- Primary Care Physician at [facility]   I connected with current patient today and beyond visually recognizing the correct individual, I verified that I am speaking with the correct person using two identifiers.   Location of the patient: Home  Location of the provider: Office Only the patient (+/- their family members at pt's discretion) and myself were participating in the encounter    - This visit type was conducted due to national recommendations for restrictions regarding the COVID-19 Pandemic (e.g. social distancing) in an effort to limit this patient's exposure and mitigate transmission in our community.  This format is felt to be most appropriate for this patient at this time.   - The patient did have access to video technology today   - No physical exam could be performed with this format, beyond that communicated to [place] by the patient/ family members as noted.   - Additionally my office staff/ schedulers discussed with the patient that there may be a monetary charge related to this service, depending on patient's medical insurance.   The patient expressed understanding, and agreed to proceed.      History of Present Illness:  Environmental & Year-Long Allergies Takes Flonase in the morning and before bed for year long allergies.  Lifestyle Since January States he has put on 10 lbs since last visit.  States "not doing so bad."  Says "I didn't lose the 10 lbs I gained before my last visit, before I put this additional 10 lbs on."  Says "when I diet, I do a lot of fish," and when he cooks he's "big into meats."   Notes that his exercise has been "nonexistent."  Mood Management & Anxiety Mood remains stable and anxiety remains controlled.  Is totally off Prozac now.  Takes half a pill of Buspar twice daily instead of three times, half a pill at morning and night.  States he tried this past week to wean off a  little bit more, because he was feeling "less amorous" while on the Buspar, but reverted back to previous dose.  When he stopped his anxiety medication in the past, he got into a tricky situation at work and "was having difficulty making a big decision."  Now he wonders if there is another medication that can help him be more amorous.  Was really hopeful that once he was off the Prozac, he would be "at least back to normal or close" regarding his libido.  He says he thinks he was just about finished with Prozac last visit.  Alcohol Cessation He is no longer drinking, not since September 26th of 2019.  Instead of drinking beer now, he consumes non-alcoholic beer.  Says he drinks non-alcoholic beer almost like he drank beer while he was drinking.  1. HTN HPI:  -  His blood pressure has been controlled at home.  Pt is checking it at home.  Says it has been "fair."  Last he took it was 130/78.   - Patient reports good compliance with blood pressure medications.  Continues on losartan.  - Denies medication S-E   - Smoking Status noted   - He denies new onset of: chest pain, exercise intolerance, shortness of breath, dizziness, visual changes, headache, lower extremity swelling or claudication.   Last 3 blood pressure readings in our office are as follows: BP Readings from Last 3 Encounters:  07/07/19 130/78  12/16/18 124/84  11/03/18 138/85    Filed Weights   07/07/19 1529  Weight: 240 lb (108.9 kg)    2. 53 y.o. male here for cholesterol follow-up.   - Patient reports good compliance with medications or treatment plan  - Denies medication S-E.  Takes krill oil 350 mg every morning, and continues on Lipitor as prescribed.   - Smoking Status noted   - He denies new onset of: chest pain, exercise intolerance, shortness of breath, dizziness, visual changes, headache, lower extremity swelling or claudication.   Denies myalgias.  The cholesterol last visit was:  Lab Results    Component Value Date   CHOL 129 07/05/2019   HDL 31 (L) 07/05/2019   LDLCALC 67 07/05/2019   TRIG 157 (H) 07/05/2019   CHOLHDL 4.2 07/05/2019    Hepatic Function Latest Ref Rng & Units 12/14/2018 08/26/2018 07/22/2018  Total Protein 6.0 - 8.5 g/dL - - 7.1  Albumin 3.5 - 5.5 g/dL - - 4.5  AST 0 - 40 IU/L 34 - 51(H)  ALT 0 - 44 IU/L 61(H) 53(H) 95(H)  Alk Phosphatase 39 - 117 IU/L - - 86  Total Bilirubin 0.0 - 1.2 mg/dL - - 0.5     Impression and Recommendations:    1. Essential hypertension   2. Mixed hyperlipidemia   3. Hypertriglyceridemia   4. Low level of high density lipoprotein (HDL)   5. h/o Excessive drinking of alcohol   6. Elevated liver enzymes   7. Adjustment disorder with mixed anxiety and depressed mood   8. Non-seasonal allergic rhinitis, unspecified trigger   9. Environmental and seasonal allergies      - As part of my medical decision making, I reviewed the following data within the [place] History obtained from pt /family, CMA notes reviewed and incorporated if applicable, Labs reviewed, Radiograph/ tests reviewed if applicable and OV notes from prior OV's with me, as well as other specialists she/he has seen since seeing me last, were all reviewed and used in my medical decision making process today.   - Additionally, discussion had with patient regarding txmnt plan, their biases about that plan etc were used in my medical decision making today.   - The patient agreed with the plan and demonstrated an understanding of the instructions.   No barriers to understanding were identified.   - Red flag symptoms and signs discussed in detail.  Patient expressed understanding regarding what to do in case of emergency\ urgent symptoms.  The patient was advised to call back or seek an in-person evaluation if the symptoms worsen or if the condition fails to improve as anticipated.   Allergic Rhinitis & Environmental & Seasonal Allergies - Per patient,  continues use of Flonase BID morning and night. - Refill provided today.  - Will continue to monitor.  Essential hypertension - Stable at this time. - Patient continues on Losartan. - Tolerating medication well without S-E. - Continue management as established.  - Discussed goal blood pressure of 130/80 or less. - Ongoing ambulatory blood pressure monitoring encouraged.  - Will continue to monitor.  Mixed hyperlipidemia, hypertriglyceridemia, low HDL  - Lipid Panel Triglycerides = 157, down from 184 prior. LDL = 67, up from 62 prior. HDL = 31, down from 34 prior.  - Last visit, decreased to Lipitor 20 from 40, to keep LDL low and help increase HDL. - Per pt, continues on red krill oil 350 mg once in the morning.  - Encouraged patient to cook more  healthfully.  Dietary changes such as low saturated & trans fat and low carb prudent diets discussed with patient.  Encouraged regular exercise and weight loss when appropriate.   - Educational handouts provided at patient's desire.  h/o Excessive drinking of alcohol, Elevated liver enzymes  - Per pt, has discontinued use of alcohol. - Continue to avoid use of alcohol.  - Will continue to monitor.  Adjustment disorder with mixed anxiety and depressed mood  - Tapered down on mood medicine Prozac last appointment. - Discontinued Prozac.  Continue on Buspar 0.5 mg twice daily, morning and night. - Mood remains stable and anxiety controlled on current treatment.  - Continue treatment plan as established.  See med list.  - Reviewed the "spokes of the wheel" of mood and health management.  Stressed the importance of ongoing prudent habits, including regular exercise, appropriate sleep hygiene, healthful dietary habits, and prayer/meditation to relax.  - Will continue to monitor.  Loss of Libido & Recent Weight Gain - Extensively discussed that decreased libido can be due to weight gain. - Encouraged patient to look into weight loss  plans and nutrition management.  - Advised patient to look into healthy recipe resources online. - Advised patient to eat less meat and more veggies.  BMI Counseling - BMI of 31.66 Explained to patient what BMI refers to, and what it means medically.  Told patient to think about it as a "medical risk stratification measurement" and how increasing BMI is associated with increasing risk/ or worsening state of various diseases such as hypertension, hyperlipidemia, diabetes, premature OA, depression etc.  American Heart Association guidelines for healthy diet, basically Mediterranean diet, and exercise guidelines of 30 minutes 5 days per week or more discussed in detail.  Health counseling performed.  All questions answered.  Lifestyle & Preventative Health Maintenance - Advised patient to continue working toward exercising to improve overall mental, physical, and emotional health.    - Encouraged patient to engage in daily physical activity, especially a formal exercise routine.  Recommended that the patient eventually strive for at least 150 minutes of moderate cardiovascular activity per week according to guidelines established by the [facility].   - Healthy dietary habits encouraged, including low-carb, and high amounts of lean protein in diet.   - Patient should also consume adequate amounts of water.   Return for CPE/ yrly physical, come fasting near future.    Meds ordered this encounter  Medications   fluticasone (FLONASE) 50 MCG/ACT nasal spray    Sig: 1 spray each nostril following sinus rinses twice daily    Dispense:  16 g    Refill:  2    Medications Discontinued During This Encounter  Medication Reason   fluticasone (FLONASE) 50 MCG/ACT nasal spray      I provided 22 minutes of non-face-to-face time during this encounter,with over 50% of the time in direct counseling on patients medical conditions/ medical concerns.  Additional time was spent with charting and coordination  of care after the actual visit commenced.   Note:  This note was prepared with assistance of Dragon voice recognition software. Occasional wrong-word or sound-a-like substitutions may have occurred due to the inherent limitations of voice recognition software.  This document serves as a record of services personally performed by [name], DO. It was created on her behalf by Toni Amend, a trained medical scribe. The creation of this record is based on the scribe's personal observations and the provider's statements to them.   I have reviewed  the above medical documentation for accuracy and completeness and I concur.  [name], DO 07/09/2019 1:34 PM        Patient Care Team    Relationship Specialty Notifications Start End  [name], DO PCP - General Family Medicine  05/12/18     -Vitals obtained; medications/ allergies reconciled;  personal medical, social, Sx etc.histories were updated by CMA, reviewed by me and are reflected in chart  Patient Active Problem List   Diagnosis Date Noted   Environmental and seasonal allergies 08/06/2019   Adjustment disorder with mixed anxiety and depressed mood 09/16/2018   Plantar fasciitis, left 09/16/2018   Elevated liver enzymes 08/29/2018   Essential hypertension 07/26/2018   Hypertriglyceridemia 07/26/2018   Mixed hyperlipidemia 07/26/2018   Low level of high density lipoprotein (HDL) 07/26/2018   Elevated blood pressure reading 06/22/2018   h/o Excessive drinking of alcohol 06/22/2018   History of smoking 25-50 pack years 06/22/2018   Family history of diabetes mellitus 06/22/2018   Family history of essential hypertension 06/22/2018   Family history of prostate cancer in father 06/22/2018   Family history of alcoholism 06/22/2018   Family history of combined hyperlipidemia 06/22/2018     Current Meds  Medication Sig   atorvastatin (LIPITOR) 20 MG tablet Take 1 tablet (20 mg total) by  mouth at bedtime.   busPIRone (BUSPAR) 10 MG tablet Take 0.5 tablets (5 mg total) by mouth 3 (three) times daily. Needs office visit for further refills (Patient taking differently: Take 5 mg by mouth 2 (two) times daily. Needs office visit for further refills)   Cinnamon 500 MG capsule Take 1,000 mg by mouth daily.   fluticasone (FLONASE) 50 MCG/ACT nasal spray 1 spray each nostril following sinus rinses twice daily   glucosamine-chondroitin 500-400 MG tablet Take 1 tablet by mouth 3 (three) times daily.   Krill Oil 350 MG CAPS Take 1 capsule by mouth daily.   loratadine (KLS ALLERCLEAR) 10 MG tablet Take 10 mg by mouth daily.   Multiple Vitamin (ONE-A-DAY MENS PO) Take 1 tablet by mouth daily.   [DISCONTINUED] fluticasone (FLONASE) 50 MCG/ACT nasal spray Place into both nostrils daily.   [DISCONTINUED] losartan (COZAAR) 50 MG tablet Take 1 tablet (50 mg total) by mouth daily.     Allergies  Allergen Reactions   Nylon Rash    sutures    ROS:  See above HPI for pertinent positives and negatives   Objective:   Blood pressure 130/78, height 6' 1"  (1.854 m), weight 240 lb (108.9 kg).  (if some vitals are omitted, this means that patient was UNABLE to obtain them even though they were asked to get them prior to OV today.  They were asked to call us at their earliest convenience with these once obtained.)  General: A & O * 3; visually in no acute distress; in usual state of health.  Skin: Visible skin appears normal and pt's usual skin color HEENT:  EOMI, head is normocephalic and atraumatic.  Sclera are anicteric. Neck has a good range of motion.  Lips are noncyanotic Chest: normal chest excursion and movement Respiratory: speaking in full sentences, no conversational dyspnea; no use of accessory muscles Psych: insight good, mood- appears full

## 2019-07-08 ENCOUNTER — Other Ambulatory Visit: Payer: Self-pay | Admitting: Family Medicine

## 2019-07-08 DIAGNOSIS — I10 Essential (primary) hypertension: Secondary | ICD-10-CM

## 2019-08-06 DIAGNOSIS — J3089 Other allergic rhinitis: Secondary | ICD-10-CM | POA: Insufficient documentation

## 2019-09-02 ENCOUNTER — Telehealth: Payer: Self-pay | Admitting: Family Medicine

## 2019-09-02 NOTE — Telephone Encounter (Signed)
-----   Message from Jerilee Field, [place] sent at 06/13/2019  9:09 AM EDT ----- Patient is due for follow up with FBW before, please call the patient to make an appointment.  30 days of meds sent in Thanks. MPulliam, CMA/RT(R)

## 2019-09-05 NOTE — Telephone Encounter (Signed)
Please document that pt was called to schedule an appointment.  Charyl Bigger, CMA

## 2019-10-31 ENCOUNTER — Other Ambulatory Visit: Payer: Self-pay | Admitting: Family Medicine

## 2019-11-01 MED ORDER — BUSPIRONE HCL 10 MG PO TABS: 5.0000 mg | ORAL_TABLET | Freq: Three times a day (TID) | ORAL | 0 refills | Status: DC

## 2019-11-19 ENCOUNTER — Other Ambulatory Visit: Payer: Self-pay | Admitting: Family Medicine

## 2019-11-21 ENCOUNTER — Telehealth: Payer: Self-pay | Admitting: Family Medicine

## 2019-11-21 MED ORDER — BUSPIRONE HCL 10 MG PO TABS: 5.0000 mg | ORAL_TABLET | Freq: Three times a day (TID) | ORAL | 0 refills | Status: DC

## 2019-11-21 NOTE — Telephone Encounter (Signed)
Can you please call patient and schedule CPE. Please let patient know he needs this apt for refills. AS, CMA

## 2019-11-24 ENCOUNTER — Other Ambulatory Visit: Payer: Self-pay | Admitting: Family Medicine

## 2019-11-24 DIAGNOSIS — J3089 Other allergic rhinitis: Secondary | ICD-10-CM

## 2019-11-25 ENCOUNTER — Other Ambulatory Visit: Payer: Self-pay | Admitting: Family Medicine

## 2019-11-25 DIAGNOSIS — E782 Mixed hyperlipidemia: Secondary | ICD-10-CM

## 2019-12-14 ENCOUNTER — Other Ambulatory Visit: Payer: Self-pay | Admitting: Family Medicine

## 2019-12-15 ENCOUNTER — Encounter: Payer: Self-pay | Admitting: Family Medicine

## 2019-12-16 ENCOUNTER — Other Ambulatory Visit: Payer: Self-pay

## 2019-12-16 ENCOUNTER — Encounter: Payer: Self-pay | Admitting: Family Medicine

## 2019-12-16 ENCOUNTER — Ambulatory Visit (INDEPENDENT_AMBULATORY_CARE_PROVIDER_SITE_OTHER): Payer: BC Managed Care – PPO | Admitting: Family Medicine

## 2019-12-16 VITALS — BP 125/72 | Ht 73.0 in | Wt 226.0 lb

## 2019-12-16 DIAGNOSIS — Z87891 Personal history of nicotine dependence: Secondary | ICD-10-CM

## 2019-12-16 DIAGNOSIS — E781 Pure hyperglyceridemia: Secondary | ICD-10-CM | POA: Diagnosis not present

## 2019-12-16 DIAGNOSIS — E663 Overweight: Secondary | ICD-10-CM

## 2019-12-16 DIAGNOSIS — F4323 Adjustment disorder with mixed anxiety and depressed mood: Secondary | ICD-10-CM | POA: Diagnosis not present

## 2019-12-16 DIAGNOSIS — I10 Essential (primary) hypertension: Secondary | ICD-10-CM

## 2019-12-16 DIAGNOSIS — E782 Mixed hyperlipidemia: Secondary | ICD-10-CM

## 2019-12-16 DIAGNOSIS — F1010 Alcohol abuse, uncomplicated: Secondary | ICD-10-CM

## 2019-12-16 DIAGNOSIS — Z23 Encounter for immunization: Secondary | ICD-10-CM

## 2019-12-16 DIAGNOSIS — R748 Abnormal levels of other serum enzymes: Secondary | ICD-10-CM

## 2019-12-16 DIAGNOSIS — E786 Lipoprotein deficiency: Secondary | ICD-10-CM

## 2019-12-16 MED ORDER — BUSPIRONE HCL 7.5 MG PO TABS: 7.5000 mg | ORAL_TABLET | Freq: Three times a day (TID) | ORAL | 1 refills | Status: DC

## 2019-12-16 NOTE — Progress Notes (Signed)
Telehealth office visit note for Patrick Avila, D.O- at Primary Care at [facility]   I connected with current patient today and verified that I am speaking with the correct person using two identifiers.   . Location of the patient: Home . Location of the provider: Office Only the patient (+/- their family members at pt's discretion) and myself were participating in the encounter - This visit type was conducted due to national recommendations for restrictions regarding the COVID-19 Pandemic (e.g. social distancing) in an effort to limit this patient's exposure and mitigate transmission in our community.  This format is felt to be most appropriate for this patient at this time.   - The patient did not have access to video technology or had technical difficulties with video requiring transitioning to audio format only. - No physical exam could be performed with this format, beyond that communicated to [place] by the patient/ family members as noted.   - Additionally my office staff/ schedulers discussed with the patient that there may be a monetary charge related to this service, depending on their medical insurance.   The patient expressed understanding, and agreed to proceed.       History of Present Illness: No chief complaint on file.   Patrick Avila, am serving as scribe for Dr. Mellody Avila.  Notes "not doing too bad, I guess."  - Mood Management From a mood standpoint, states he is doing "pretty good.  A little more anxiousness; I'm sketchy about the whole COVID junk."  Says he's "just trying to make it through until we all get access to vaccines, I guess."  He continues on Buspar, half tablet twice daily.  "Here lately, if I've anticipated a stressful day, I've taken a full tab in the morning, and that's seemed to work real well."  States he wouldn't be opposed to taking a whole tablet twice daily, but states he is "more anti-medicine than pro-medicine."  Notes at  one point he tried to stop and wean down off the Buspar, "but that did not work well."  States he went four days without it and resumed the medicine right away due to increased anxiety.  - History of Excessive Alcohol Drinking Hasn't had any alcohol since September of 2019.  - Weight Gain and Loss over 2020 Notes he "got a little chunky" during the beginning of COVID-19, working at home.  States he was up to about 245 lbs and over the last 4-5 months he's been dieting and working out, down to 226 lbs this morning.  Notes his goal is between 205 and 210 lbs.  HPI:  Hypertension:  -  His blood pressure at home has been running: States that his blood pressure is okay.  Notes the "low number is running in the low mid 70's, but I don't like that the high number is five points above 120, but I don't know."  - Patient reports good compliance with medication and/or lifestyle modification.  Patient states his plan is to reduce his blood pressure through exercise and weight loss.  He lifts one day, and then does cardio (the elliptical) the next.  Notes he's been hitting between 3-4 times per week, "I would like to do five."  Sometimes when he bends over while lifting weights, he has to pause as he stands back up to avoid a sensation of lightheadedness/dizziness.  States he was worried he might be hyperventilating during these events.  Patient plans to keep  an eye out for symptoms moving forward, especially if lightheadedness or dizziness occurs in his daily life and not just when he is exerting himself.  - His denies acute concerns or problems related to treatment plan  - He denies new onset of: chest pain, exercise intolerance, shortness of breath, dizziness, visual changes, headache, lower extremity swelling or claudication.   Last 3 blood pressure readings in our office are as follows: BP Readings from Last 3 Encounters:  12/16/19 125/72  07/07/19 130/78  12/16/18 124/84   Filed Weights    12/16/19 0803  Weight: 226 lb (102.5 kg)   HPI:  Hyperlipidemia:  54 y.o. male here for cholesterol follow-up.   - Patient reports good compliance with treatment plan of:  medication and/ or lifestyle management.  He continues Lipitor nightly.  - Patient denies any acute concerns or problems with management plan   - He denies new onset of: myalgias, arthralgias, increased fatigue more than normal, chest pains, exercise intolerance, shortness of breath, dizziness, visual changes, headache, lower extremity swelling or claudication.   Most recent cholesterol panel was:  Lab Results  Component Value Date   CHOL 129 07/05/2019   HDL 31 (L) 07/05/2019   LDLCALC 67 07/05/2019   TRIG 157 (H) 07/05/2019   CHOLHDL 4.2 07/05/2019   Hepatic Function Latest Ref Rng & Units 12/14/2018 08/26/2018 07/22/2018  Total Protein 6.0 - 8.5 g/dL - - 7.1  Albumin 3.5 - 5.5 g/dL - - 4.5  AST 0 - 40 IU/L 34 - 51(H)  ALT 0 - 44 IU/L 61(H) 53(H) 95(H)  Alk Phosphatase 39 - 117 IU/L - - 86  Total Bilirubin 0.0 - 1.2 mg/dL - - 0.5    GAD 7 : Generalized Anxiety Score 07/07/2019 11/03/2018  Nervous, Anxious, on Edge 0 0  Control/stop worrying 0 1  Worry too much - different things 0 1  Trouble relaxing 0 0  Restless 0 0  Easily annoyed or irritable 0 0  Afraid - awful might happen 0 0  Total GAD 7 Score 0 2  Anxiety Difficulty Not difficult at all -    Depression screen [facility] 2/9 12/16/2019 12/16/2019 07/07/2019 12/16/2018 11/03/2018  Decreased Interest 0 0 0 0 0  Down, Depressed, Hopeless 0 0 0 0 0  PHQ - 2 Score 0 0 0 0 0  Altered sleeping 0 0 1 1 0  Tired, decreased energy 0 0 0 0 0  Change in appetite 0 0 0 0 0  Feeling bad or failure about yourself  0 0 0 0 0  Trouble concentrating 0 0 0 0 0  Moving slowly or fidgety/restless 0 0 0 0 0  Suicidal thoughts 0 0 0 0 0  PHQ-9 Score 0 0 1 1 0  Difficult doing work/chores - - Not difficult at all - Not difficult at all      Impression and  Recommendations:    1. Adjustment disorder with mixed anxiety and depressed mood   2. Essential hypertension   3. Mixed hyperlipidemia   4. Hypertriglyceridemia   5. Low level of high density lipoprotein (HDL)   6. h/o Excessive drinking of alcohol   7. Overweight (BMI 25.0-29.9)   8. Elevated liver enzymes   9. Flu vaccine need   10. History of smoking 25-50 pack years       Adjustment Disorder w/ Mixed Anxiety and Depressed Mood - Per patient, stable at this time on current management.  - Reviewed appropriate Buspar dosage with  patient today.   - Educated patient regarding recommended use of medication.  - Discussed increasing patient's dose to better manage anxiety.  Reviewed that using prescriptions appropriately under the guidance of a medical professional is considered safe.  - 7.5 mg tablet provided, to be taken 2-3 times per day.  See med list.  - Told patient to consult with clinic for guidance before he decides to discontinue his Buspar.  - In addition to prescription intervention, reviewed the "spokes of the wheel" of mood and health management.  Stressed the importance of ongoing prudent habits, including regular exercise, appropriate sleep hygiene, healthful dietary habits, and prayer/meditation to relax.  - Will continue to monitor.   H/o Excessive Drinking of Alcohol - Stable at this time. - Patient continues to avoid drinking alcohol. - Encouraged patient to continue with avoiding alcohol. - Will continue to monitor.   Essential Hypertension - Blood pressure currently is at goal, consistently under 130/80. - Patient will continue current treatment regimen.  See med list.  - Counseled patient on pathophysiology of disease and discussed various treatment options, which always includes dietary and lifestyle modification as first line.   - Lifestyle changes such as dash and heart healthy diets and engaging in a regular exercise program discussed extensively  with patient.   - Reviewed that if the patient ever develops BP that is consistently too low, or symptoms of orthostatic hypotension such as dizziness or lightheadedness, we can adjust the patient's treatment plan as needed.  - Lengthy conversation held with patient today regarding signs of symptomatic low blood pressure.  Patient agrees to keep an eye out for symptoms as discussed and call clinic PRN.  - Ambulatory blood pressure monitoring encouraged at least 3 times weekly.  Keep log and bring in every office visit.  Reminded patient that if he ever feels poorly in any way, to check his blood pressure and pulse.  - Handouts provided at patient's desire and/or told to go online at the [place] website for further information  - We will continue to monitor.   Mixed Hyperlipidemia, Hypertriglyceridemia, Low HDL - Cholesterol levels at goal last check. - Pt will continue current treatment regimen on Lipitor.  See med list.  - Dietary changes such as low saturated & trans fat diets for hyperlipidemia and low carb diets for hypertriglyceridemia discussed with patient.    - Encouraged patient to follow AHA guidelines for regular exercise and also engage in weight loss if BMI above 25.   - We will continue to monitor and re-check near future as discussed.   BMI Counseling - Overweight, Body mass index is 29.82 kg/m Explained to patient what BMI refers to, and what it means medically.  Told patient to think about it as a "medical risk stratification measurement" and how increasing BMI is associated with increasing risk/ or worsening state of various diseases such as hypertension, hyperlipidemia, diabetes, premature OA, depression etc.  - Discussed that as the patient continues to lose weight, his need for certain medicines, such as blood pressure and cholesterol medicines, may decrease.  American Heart Association guidelines for healthy diet, basically Mediterranean diet,  and exercise guidelines of 30 minutes 5 days per week or more discussed in detail.  Health counseling performed.  All questions answered.   Health Counseling & Preventative Maintenance - Advised patient to continue working toward exercising to improve overall mental, physical, and emotional health.    - Encouraged patient to engage in daily physical activity, especially a  formal exercise routine.  Recommended that the patient eventually strive for at least 150 minutes of moderate cardiovascular activity per week according to guidelines established by the [facility].   - Healthy dietary habits encouraged, including low-carb, and high amounts of lean protein in diet.   - Patient should also consume adequate amounts of water.   COVID-19 Counseling - Novel Covid -19 counseling done; all questions were answered.   - Current CDC / federal and [place] guidelines reviewed with patient  - Reminded pt of extreme importance of social distancing; wearing a mask when out in public; insensate handwashing and cleaning of surfaces, avoiding unnecessary trips for shopping and avoiding ALL but emergency appts etc. - Told patient to be prepared, not scared; and be smart for the sake of others - Patient will call with any additional concerns   Recommendations - Need for CPE and full fasting lab work near future.  - As part of my medical decision making, I reviewed the following data within the [place] History obtained from pt /family, CMA notes reviewed and incorporated if applicable, Labs reviewed, Radiograph/ tests reviewed if applicable and OV notes from prior OV's with me, as well as other specialists she/he has seen since seeing me last, were all reviewed and used in my medical decision making process today.    - Additionally, discussion had with patient regarding our treatment plan, and their biases/concerns about that plan were used in my medical decision making today.    - The  patient agreed with the plan and demonstrated an understanding of the instructions.   No barriers to understanding were identified.    - Red flag symptoms and signs discussed in detail.  Patient expressed understanding regarding what to do in case of emergency\ urgent symptoms.   - The patient was advised to call back or seek an in-person evaluation if the symptoms worsen or if the condition fails to improve as anticipated.   Return for CPE and full fasting blood work near future.    Meds ordered this encounter  Medications  . busPIRone (BUSPAR) 7.5 MG tablet    Sig: Take 1 tablet (7.5 mg total) by mouth 3 (three) times daily.    Dispense:  270 tablet    Refill:  1    Medications Discontinued During This Encounter  Medication Reason  . busPIRone (BUSPAR) 10 MG tablet Reorder     I provided 22+ minutes of non face-to-face time during this encounter.  Additional time was spent with charting and coordination of care before and after the actual visit commenced.   Note:  This note was prepared with assistance of Dragon voice recognition software. Occasional wrong-word or sound-a-like substitutions may have occurred due to the inherent limitations of voice recognition software.  This document serves as a record of services personally performed by Patrick Dance, DO. It was created on her behalf by Patrick Avila, a trained medical scribe. The creation of this record is based on the scribe's personal observations and the provider's statements to them.   This case required medical decision making of at least moderate complexity. The above documentation has been reviewed to be accurate and was completed by Patrick Avila, D.O.     Patient Care Team    Relationship Specialty Notifications Start End  Patrick Dance, DO PCP - General Family Medicine  05/12/18      -Vitals obtained; medications/ allergies reconciled;  personal medical, social, Sx etc.histories were updated by CMA,  reviewed  by me and are reflected in chart   Patient Active Problem List   Diagnosis Date Noted  . Essential hypertension 07/26/2018  . Hypertriglyceridemia 07/26/2018  . Mixed hyperlipidemia 07/26/2018  . Low level of high density lipoprotein (HDL) 07/26/2018  . Adjustment disorder with mixed anxiety and depressed mood 09/16/2018  . Elevated liver enzymes 08/29/2018  . h/o Excessive drinking of alcohol 06/22/2018  . Environmental and seasonal allergies 08/06/2019  . Plantar fasciitis, left 09/16/2018  . History of smoking 25-50 pack years 06/22/2018  . Overweight (BMI 25.0-29.9) 12/25/2019  . Elevated blood pressure reading 06/22/2018  . Family history of diabetes mellitus 06/22/2018  . Family history of essential hypertension 06/22/2018  . Family history of prostate cancer in father 06/22/2018  . Family history of alcoholism 06/22/2018  . Family history of combined hyperlipidemia 06/22/2018     Current Meds  Medication Sig  . atorvastatin (LIPITOR) 20 MG tablet TAKE 1 TABLET (20 MG TOTAL) BY MOUTH AT BEDTIME.  . busPIRone (BUSPAR) 7.5 MG tablet Take 1 tablet (7.5 mg total) by mouth 3 (three) times daily.  . Cinnamon 500 MG capsule Take 1,000 mg by mouth daily.  . fluticasone (FLONASE) 50 MCG/ACT nasal spray PLACE 1 SPRAY INTO EACH NOSTRIL FOLLOWING SINUS RINSES TWICE A DAY.  Marland Kitchen glucosamine-chondroitin 500-400 MG tablet Take 1 tablet by mouth 3 (three) times daily.  Patrick Avila 350 MG CAPS Take 1 capsule by mouth daily.  Marland Kitchen loratadine (KLS ALLERCLEAR) 10 MG tablet Take 10 mg by mouth daily.  Marland Kitchen losartan (COZAAR) 50 MG tablet TAKE 1 TABLET BY MOUTH DAILY.  . Multiple Vitamin (ONE-A-DAY MENS PO) Take 1 tablet by mouth daily.  . [DISCONTINUED] busPIRone (BUSPAR) 10 MG tablet Take 0.5 tablets (5 mg total) by mouth 3 (three) times daily. **PATIENT NEEDS APT FOR ANY FURTHER REFILLS**     Allergies:  Allergies  Allergen Reactions  . Nylon Rash    sutures     ROS:  See above HPI  for pertinent positives and negatives   Objective:   Blood pressure 125/72, height 6' 1"  (1.854 m), weight 226 lb (102.5 kg).  (if some vitals are omitted, this means that patient was UNABLE to obtain them even though they were asked to get them prior to OV today.  They were asked to call us at their earliest convenience with these once obtained. )  General: A & O * 3; sounds in no acute distress; in usual state of health.  Skin: Pt confirms warm and dry extremities and pink fingertips HEENT: Pt confirms lips non-cyanotic Chest: Patient confirms normal chest excursion and movement Respiratory: speaking in full sentences, no conversational dyspnea; patient confirms no use of accessory muscles Psych: insight appears good, mood- appears full

## 2019-12-25 DIAGNOSIS — E663 Overweight: Secondary | ICD-10-CM | POA: Insufficient documentation

## 2020-02-17 ENCOUNTER — Encounter: Payer: Self-pay | Admitting: Family Medicine

## 2020-02-20 ENCOUNTER — Encounter: Payer: Self-pay | Admitting: Family Medicine

## 2020-02-20 ENCOUNTER — Ambulatory Visit (INDEPENDENT_AMBULATORY_CARE_PROVIDER_SITE_OTHER): Payer: BC Managed Care – PPO | Admitting: Family Medicine

## 2020-02-20 ENCOUNTER — Other Ambulatory Visit: Payer: Self-pay

## 2020-02-20 VITALS — BP 121/78 | HR 82 | Temp 98.1°F | Resp 12 | Ht 73.0 in | Wt 229.1 lb

## 2020-02-20 DIAGNOSIS — Z1211 Encounter for screening for malignant neoplasm of colon: Secondary | ICD-10-CM

## 2020-02-20 DIAGNOSIS — Z0000 Encounter for general adult medical examination without abnormal findings: Secondary | ICD-10-CM

## 2020-02-20 DIAGNOSIS — Z833 Family history of diabetes mellitus: Secondary | ICD-10-CM | POA: Diagnosis not present

## 2020-02-20 DIAGNOSIS — E786 Lipoprotein deficiency: Secondary | ICD-10-CM

## 2020-02-20 DIAGNOSIS — I10 Essential (primary) hypertension: Secondary | ICD-10-CM

## 2020-02-20 DIAGNOSIS — Z719 Counseling, unspecified: Secondary | ICD-10-CM

## 2020-02-20 DIAGNOSIS — N529 Male erectile dysfunction, unspecified: Secondary | ICD-10-CM

## 2020-02-20 DIAGNOSIS — E781 Pure hyperglyceridemia: Secondary | ICD-10-CM

## 2020-02-20 DIAGNOSIS — E782 Mixed hyperlipidemia: Secondary | ICD-10-CM

## 2020-02-20 DIAGNOSIS — F1010 Alcohol abuse, uncomplicated: Secondary | ICD-10-CM

## 2020-02-20 DIAGNOSIS — R748 Abnormal levels of other serum enzymes: Secondary | ICD-10-CM

## 2020-02-20 MED ORDER — SILDENAFIL CITRATE 20 MG PO TABS: 20.0000 mg | ORAL_TABLET | ORAL | 0 refills | Status: DC | PRN

## 2020-02-20 NOTE — Patient Instructions (Signed)
Preventive Care, Male Preventive care refers to lifestyle choices and visits with your health care provider that can promote health and wellness. What does preventive care include?   A yearly physical exam. This is also called an annual well check.  Dental exams once or twice a year.  Routine eye exams. Ask your health care provider how often you should have your eyes checked.  Personal lifestyle choices, including: ? Daily care of your teeth and gums. ? Regular physical activity. ? Eating a healthy diet. ? Avoiding tobacco and drug use. ? Limiting alcohol use. ? Practicing safe sex. ? Taking low doses of aspirin every day. ? Taking vitamin and mineral supplements as recommended by your health care provider. What happens during an annual well check? The services and screenings done by your health care provider during your annual well check will depend on your age, overall health, lifestyle risk factors, and family history of disease. Counseling Your health care provider may ask you questions about your:  Alcohol use.  Tobacco use.  Drug use.  Emotional well-being.  Home and relationship well-being.  Sexual activity.  Eating habits.  History of falls.  Memory and ability to understand (cognition).  Work and work Statistician. Screening You may have the following tests or measurements:  Height, weight, and BMI.  Blood pressure.  Lipid and cholesterol levels. These may be checked every 5 years, or more frequently if you are over 40 years old.  Skin check.  Lung cancer screening. You may have this screening every year starting at age 42 if you have a 30-pack-year history of smoking and currently smoke or have quit within the past 15 years.  Colorectal cancer screening. All adults should have this screening starting at age 39 and continuing until age 27. You will have tests every 1-10 years, depending on your results and the type of screening test. People at  increased risk should start screening at an earlier age. Screening tests may include: ? Guaiac-based fecal occult blood testing. ? Fecal immunochemical test (FIT). ? Stool DNA test. ? Virtual colonoscopy. ? Sigmoidoscopy. During this test, a flexible tube with a tiny camera (sigmoidoscope) is used to examine your rectum and lower colon. The sigmoidoscope is inserted through your anus into your rectum and lower colon. ? Colonoscopy. During this test, a long, thin, flexible tube with a tiny camera (colonoscope) is used to examine your entire colon and rectum.  Prostate cancer screening. Recommendations will vary depending on your family history and other risks.  Hepatitis C blood test.  Hepatitis B blood test.  Sexually transmitted disease (STD) testing.  Diabetes screening. This is done by checking your blood sugar (glucose) after you have not eaten for a while (fasting). You may have this done every 1-3 years.  Abdominal aortic aneurysm (AAA) screening. You may need this if you are a current or former smoker.  Osteoporosis. You may be screened starting at age 18 if you are at high risk. Talk with your health care provider about your test results, treatment options, and if necessary, the need for more tests. Vaccines Your health care provider may recommend certain vaccines, such as:  Influenza vaccine. This is recommended every year.  Tetanus, diphtheria, and acellular pertussis (Tdap, Td) vaccine. You may need a Td booster every 10 years.  Varicella vaccine. You may need this if you have not been vaccinated.  Zoster vaccine. You may need this after age 78.  Measles, mumps, and rubella (MMR) vaccine. You may need  at least one dose of MMR if you were born in 1957 or later. You may also need a second dose. °· Pneumococcal 13-valent conjugate (PCV13) vaccine. One dose is recommended after age 65. °· Pneumococcal polysaccharide (PPSV23) vaccine. One dose is recommended after age  65. °· Meningococcal vaccine. You may need this if you have certain conditions. °· Hepatitis A vaccine. You may need this if you have certain conditions or if you travel or work in places where you may be exposed to hepatitis A. °· Hepatitis B vaccine. You may need this if you have certain conditions or if you travel or work in places where you may be exposed to hepatitis B. °· Haemophilus influenzae type b (Hib) vaccine. You may need this if you have certain risk factors. °Talk to your health care provider about which screenings and vaccines you need and how often you need them. °This information is not intended to replace advice given to you by your health care provider. Make sure you discuss any questions you have with your health care provider. °Document Released: 12/21/2015 Document Revised: 01/14/2018 Document Reviewed: 09/25/2015 °Elsevier Interactive Patient Education © 2019 [employer] ° ° ° ° ° ° ° °Preventive Care for Adults, Male °A healthy lifestyle and preventive care can promote health and wellness. Preventive health guidelines for men include the following key practices: °· A routine yearly physical is a good way to check with your health care provider about your health and preventative screening. It is a chance to share any concerns and updates on your health and to receive a thorough exam. °· Visit your dentist for a routine exam and preventative care every 6 months. Brush your teeth twice a day and floss once a day. Good oral hygiene prevents tooth decay and gum disease. °· The frequency of eye exams is based on your age, health, family medical history, use of contact lenses, and other factors. Follow your health care provider's recommendations for frequency of eye exams. °· Eat a healthy diet. Foods such as vegetables, fruits, whole grains, low-fat dairy products, and lean protein foods contain the nutrients you need without too many calories. Decrease your intake of foods high in solid fats,  added sugars, and salt. Eat the right amount of calories for you. Get information about a proper diet from your health care provider, if necessary. °· Regular physical exercise is one of the most important things you can do for your health. Most adults should get at least 150 minutes of moderate-intensity exercise (any activity that increases your heart rate and causes you to sweat) each week. In addition, most adults need muscle-strengthening exercises on 2 or more days a week. °· Maintain a healthy weight. The body mass index (BMI) is a screening tool to identify possible weight problems. It provides an estimate of body fat based on height and weight. Your health care provider can find your BMI and can help you achieve or maintain a healthy weight. For adults 20 years and older: °¨ A BMI below 18.5 is considered underweight. °¨ A BMI of 18.5 to 24.9 is normal. °¨ A BMI of 25 to 29.9 is considered overweight. °¨ A BMI of 30 and above is considered obese. °· Maintain normal blood lipids and cholesterol levels by exercising and minimizing your intake of saturated fat. Eat a balanced diet with plenty of fruit and vegetables. Blood tests for lipids and cholesterol should begin at age 20 and be repeated every 5 years. If your lipid or cholesterol   levels are high, you are over 50, or you are at high risk for heart disease, you may need your cholesterol levels checked more frequently. Ongoing high lipid and cholesterol levels should be treated with medicines if diet and exercise are not working.  If you smoke, find out from your health care provider how to quit. If you do not use tobacco, do not start.  Lung cancer screening is recommended for adults aged 71-80 years who are at high risk for developing lung cancer because of a history of smoking. A yearly low-dose CT scan of the lungs is recommended for people who have at least a 30-pack-year history of smoking and are a current smoker or have quit within the past 15  years. A pack year of smoking is smoking an average of 1 pack of cigarettes a day for 1 year (for example: 1 pack a day for 30 years or 2 packs a day for 15 years). Yearly screening should continue until the smoker has stopped smoking for at least 15 years. Yearly screening should be stopped for people who develop a health problem that would prevent them from having lung cancer treatment.  If you choose to drink alcohol, do not have more than 2 drinks per day. One drink is considered to be 12 ounces (355 mL) of beer, 5 ounces (148 mL) of wine, or 1.5 ounces (44 mL) of liquor.  Avoid use of street drugs. Do not share needles with anyone. Ask for help if you need support or instructions about stopping the use of drugs.  High blood pressure causes heart disease and increases the risk of stroke. Your blood pressure should be checked at least every 1-2 years. Ongoing high blood pressure should be treated with medicines, if weight loss and exercise are not effective.  If you are 36-60 years old, ask your health care provider if you should take aspirin to prevent heart disease.  Diabetes screening is done by taking a blood sample to check your blood glucose level after you have not eaten for a certain period of time (fasting). If you are not overweight and you do not have risk factors for diabetes, you should be screened once every 3 years starting at age 42. If you are overweight or obese and you are 20-59 years of age, you should be screened for diabetes every year as part of your cardiovascular risk assessment.  Colorectal cancer can be detected and often prevented. Most routine colorectal cancer screening begins at the age of 80 and continues through age 41. However, your health care provider may recommend screening at an earlier age if you have risk factors for colon cancer. On a yearly basis, your health care provider may provide home test kits to check for hidden blood in the stool. Use of a small camera  at the end of a tube to directly examine the colon (sigmoidoscopy or colonoscopy) can detect the earliest forms of colorectal cancer. Talk to your health care provider about this at age 18, when routine screening begins. Direct exam of the colon should be repeated every 5-10 years through age 86, unless early forms of precancerous polyps or small growths are found.  People who are at an increased risk for hepatitis B should be screened for this virus. You are considered at high risk for hepatitis B if:  You were born in a country where hepatitis B occurs often. Talk with your health care provider about which countries are considered high risk.  Your parents  were born in a high-risk country and you have not received a shot to protect against hepatitis B (hepatitis B vaccine).  You have HIV or AIDS.  You use needles to inject street drugs.  You live with, or have sex with, someone who has hepatitis B.  You are a man who has sex with other men (MSM).  You get hemodialysis treatment.  You take certain medicines for conditions such as cancer, organ transplantation, and autoimmune conditions.  Hepatitis C blood testing is recommended for all people born from [birth year] through 1965 and any individual with known risks for hepatitis C.  Practice safe sex. Use condoms and avoid high-risk sexual practices to reduce the spread of sexually transmitted infections (STIs). STIs include gonorrhea, chlamydia, syphilis, trichomonas, herpes, HPV, and human immunodeficiency virus (HIV). Herpes, HIV, and HPV are viral illnesses that have no cure. They can result in disability, cancer, and death.  If you are a man who has sex with other men, you should be screened at least once per year for:  HIV.  Urethral, rectal, and pharyngeal infection of gonorrhea, chlamydia, or both.  If you are at risk of being infected with HIV, it is recommended that you take a prescription medicine daily to prevent HIV infection. This  is called preexposure prophylaxis (PrEP). You are considered at risk if:  You are a man who has sex with other men (MSM) and have other risk factors.  You are a heterosexual man, are sexually active, and are at increased risk for HIV infection.  You take drugs by injection.  You are sexually active with a partner who has HIV.  Talk with your health care provider about whether you are at high risk of being infected with HIV. If you choose to begin PrEP, you should first be tested for HIV. You should then be tested every 3 months for as long as you are taking PrEP.  A one-time screening for abdominal aortic aneurysm (AAA) and surgical repair of large AAAs by ultrasound are recommended for men ages 42 to 30 years who are current or former smokers.  Healthy men should no longer receive prostate-specific antigen (PSA) blood tests as part of routine cancer screening. Talk with your health care provider about prostate cancer screening.  Testicular cancer screening is not recommended for adult males who have no symptoms. Screening includes self-exam, a health care provider exam, and other screening tests. Consult with your health care provider about any symptoms you have or any concerns you have about testicular cancer.  Use sunscreen. Apply sunscreen liberally and repeatedly throughout the day. You should seek shade when your shadow is shorter than you. Protect yourself by wearing long sleeves, pants, a wide-brimmed hat, and sunglasses year round, whenever you are outdoors.  Once a month, do a whole-body skin exam, using a mirror to look at the skin on your back. Tell your health care provider about new moles, moles that have irregular borders, moles that are larger than a pencil eraser, or moles that have changed in shape or color.  Stay current with required vaccines (immunizations).  Influenza vaccine. All adults should be immunized every year.  Tetanus, diphtheria, and acellular pertussis (Td,  Tdap) vaccine. An adult who has not previously received Tdap or who does not know his vaccine status should receive 1 dose of Tdap. This initial dose should be followed by tetanus and diphtheria toxoids (Td) booster doses every 10 years. Adults with an unknown or incomplete history of completing a 3-dose  immunization series with Td-containing vaccines should begin or complete a primary immunization series including a Tdap dose. Adults should receive a Td booster every 10 years.  Varicella vaccine. An adult without evidence of immunity to varicella should receive 2 doses or a second dose if he has previously received 1 dose.  Human papillomavirus (HPV) vaccine. Males aged 11-21 years who have not received the vaccine previously should receive the 3-dose series. Males aged 22-26 years may be immunized. Immunization is recommended through the age of 25 years for any male who has sex with males and did not get any or all doses earlier. Immunization is recommended for any person with an immunocompromised condition through the age of 50 years if he did not get any or all doses earlier. During the 3-dose series, the second dose should be obtained 4-8 weeks after the first dose. The third dose should be obtained 24 weeks after the first dose and 16 weeks after the second dose.  Zoster vaccine. One dose is recommended for adults aged 86 years or older unless certain conditions are present.  Measles, mumps, and rubella (MMR) vaccine. Adults born before [birth year] generally are considered immune to measles and mumps. Adults born in [birth year] or later should have 1 or more doses of MMR vaccine unless there is a contraindication to the vaccine or there is laboratory evidence of immunity to each of the three diseases. A routine second dose of MMR vaccine should be obtained at least 28 days after the first dose for students attending postsecondary schools, health care workers, or international travelers. People who received  inactivated measles vaccine or an unknown type of measles vaccine during 1963-1967 should receive 2 doses of MMR vaccine. People who received inactivated mumps vaccine or an unknown type of mumps vaccine before 1979 and are at high risk for mumps infection should consider immunization with 2 doses of MMR vaccine. Unvaccinated health care workers born before [birth year] who lack laboratory evidence of measles, mumps, or rubella immunity or laboratory confirmation of disease should consider measles and mumps immunization with 2 doses of MMR vaccine or rubella immunization with 1 dose of MMR vaccine.  Pneumococcal 13-valent conjugate (PCV13) vaccine. When indicated, a person who is uncertain of his immunization history and has no record of immunization should receive the PCV13 vaccine. All adults 62 years of age and older should receive this vaccine. An adult aged 77 years or older who has certain medical conditions and has not been previously immunized should receive 1 dose of PCV13 vaccine. This PCV13 should be followed with a dose of pneumococcal polysaccharide (PPSV23) vaccine. Adults who are at high risk for pneumococcal disease should obtain the PPSV23 vaccine at least 8 weeks after the dose of PCV13 vaccine. Adults older than 52 years of age who have normal immune system function should obtain the PPSV23 vaccine dose at least 1 year after the dose of PCV13 vaccine.  Pneumococcal polysaccharide (PPSV23) vaccine. When PCV13 is also indicated, PCV13 should be obtained first. All adults aged 40 years and older should be immunized. An adult younger than age 62 years who has certain medical conditions should be immunized. Any person who resides in a nursing home or long-term care facility should be immunized. An adult smoker should be immunized. People with an immunocompromised condition and certain other conditions should receive both PCV13 and PPSV23 vaccines. People with human immunodeficiency virus (HIV) infection  should be immunized as soon as possible after diagnosis. Immunization during chemotherapy or radiation therapy should  be avoided. Routine use of PPSV23 vaccine is not recommended for American Indians, [place] Natives, or people younger than 65 years unless there are medical conditions that require PPSV23 vaccine. When indicated, people who have unknown immunization and have no record of immunization should receive PPSV23 vaccine. One-time revaccination 5 years after the first dose of PPSV23 is recommended for people aged 19-64 years who have chronic kidney failure, nephrotic syndrome, asplenia, or immunocompromised conditions. People who received 1-2 doses of PPSV23 before age 66 years should receive another dose of PPSV23 vaccine at age 62 years or later if at least 5 years have passed since the previous dose. Doses of PPSV23 are not needed for people immunized with PPSV23 at or after age 31 years.  Meningococcal vaccine. Adults with asplenia or persistent complement component deficiencies should receive 2 doses of quadrivalent meningococcal conjugate (MenACWY-D) vaccine. The doses should be obtained at least 2 months apart. Microbiologists working with certain meningococcal bacteria, [place] recruits, people at risk during an outbreak, and people who travel to or live in countries with a high rate of meningitis should be immunized. A first-year college student up through age 80 years who is living in a residence hall should receive a dose if he did not receive a dose on or after his 16th birthday. Adults who have certain high-risk conditions should receive one or more doses of vaccine.  Hepatitis A vaccine. Adults who wish to be protected from this disease, have chronic liver disease, work with hepatitis A-infected animals, work in hepatitis A research labs, or travel to or work in countries with a high rate of hepatitis A should be immunized. Adults who were previously unvaccinated and who anticipate close  contact with an international adoptee during the first 60 days after arrival in the [place] from a country with a high rate of hepatitis A should be immunized.  Hepatitis B vaccine. Adults should be immunized if they wish to be protected from this disease, are under age 89 years and have diabetes, have chronic liver disease, have had more than one sex partner in the past 6 months, may be exposed to blood or other infectious body fluids, are household contacts or sex partners of hepatitis B positive people, are clients or workers in certain care facilities, or travel to or work in countries with a high rate of hepatitis B.  Haemophilus influenzae type b (Hib) vaccine. A previously unvaccinated person with asplenia or sickle cell disease or having a scheduled splenectomy should receive 1 dose of Hib vaccine. Regardless of previous immunization, a recipient of a hematopoietic stem cell transplant should receive a 3-dose series 6-12 months after his successful transplant. Hib vaccine is not recommended for adults with HIV infection. Preventive Service / Frequency Ages 42 to 29  Blood pressure check.** / Every 3-5 years.  Lipid and cholesterol check.** / Every 5 years beginning at age 64.  Hepatitis C blood test.** / For any individual with known risks for hepatitis C.  Skin self-exam. / Monthly.  Influenza vaccine. / Every year.  Tetanus, diphtheria, and acellular pertussis (Tdap, Td) vaccine.** / Consult your health care provider. 1 dose of Td every 10 years.  Varicella vaccine.** / Consult your health care provider.  HPV vaccine. / 3 doses over 6 months, if 57 or younger.  Measles, mumps, rubella (MMR) vaccine.** / You need at least 1 dose of MMR if you were born in [birth year] or later. You may also need a second dose.  Pneumococcal 13-valent conjugate (  PCV13) vaccine.** / Consult your health care provider.  Pneumococcal polysaccharide (PPSV23) vaccine.** / 1 to 2 doses if you smoke  cigarettes or if you have certain conditions.  Meningococcal vaccine.** / 1 dose if you are age 51 to 42 years and a Market researcher living in a residence hall, or have one of several medical conditions. You may also need additional booster doses.  Hepatitis A vaccine.** / Consult your health care provider.  Hepatitis B vaccine.** / Consult your health care provider.  Haemophilus influenzae type b (Hib) vaccine.** / Consult your health care provider. Ages 30 to 76  Blood pressure check.** / Every year.  Lipid and cholesterol check.** / Every 5 years beginning at age 38.  Lung cancer screening. / Every year if you are aged 92-80 years and have a 30-pack-year history of smoking and currently smoke or have quit within the past 15 years. Yearly screening is stopped once you have quit smoking for at least 15 years or develop a health problem that would prevent you from having lung cancer treatment.  Fecal occult blood test (FOBT) of stool. / Every year beginning at age 24 and continuing until age 51. You may not have to do this test if you get a colonoscopy every 10 years.  Flexible sigmoidoscopy** or colonoscopy.** / Every 5 years for a flexible sigmoidoscopy or every 10 years for a colonoscopy beginning at age 76 and continuing until age 32.  Hepatitis C blood test.** / For all people born from [birth year] through 1965 and any individual with known risks for hepatitis C.  Skin self-exam. / Monthly.  Influenza vaccine. / Every year.  Tetanus, diphtheria, and acellular pertussis (Tdap/Td) vaccine.** / Consult your health care provider. 1 dose of Td every 10 years.  Varicella vaccine.** / Consult your health care provider.  Zoster vaccine.** / 1 dose for adults aged 55 years or older.  Measles, mumps, rubella (MMR) vaccine.** / You need at least 1 dose of MMR if you were born in 1955 or later. You may also need a second dose.  Pneumococcal 13-valent conjugate (PCV13) vaccine.** /  Consult your health care provider.  Pneumococcal polysaccharide (PPSV23) vaccine.** / 1 to 2 doses if you smoke cigarettes or if you have certain conditions.  Meningococcal vaccine.** / Consult your health care provider.  Hepatitis A vaccine.** / Consult your health care provider.  Hepatitis B vaccine.** / Consult your health care provider.  Haemophilus influenzae type b (Hib) vaccine.** / Consult your health care provider. Ages 75 and over  Blood pressure check.** / Every year.  Lipid and cholesterol check.**/ Every 5 years beginning at age 62.  Lung cancer screening. / Every year if you are aged 79-80 years and have a 30-pack-year history of smoking and currently smoke or have quit within the past 15 years. Yearly screening is stopped once you have quit smoking for at least 15 years or develop a health problem that would prevent you from having lung cancer treatment.  Fecal occult blood test (FOBT) of stool. / Every year beginning at age 59 and continuing until age 75. You may not have to do this test if you get a colonoscopy every 10 years.  Flexible sigmoidoscopy** or colonoscopy.** / Every 5 years for a flexible sigmoidoscopy or every 10 years for a colonoscopy beginning at age 26 and continuing until age 30.  Hepatitis C blood test.** / For all people born from [birth year] through 1963 and any individual with known risks for hepatitis C.  Abdominal aortic aneurysm (AAA) screening.** / A one-time screening for ages 73 to 4 years who are current or former smokers.  Skin self-exam. / Monthly.  Influenza vaccine. / Every year.  Tetanus, diphtheria, and acellular pertussis (Tdap/Td) vaccine.** / 1 dose of Td every 10 years.  Varicella vaccine.** / Consult your health care provider.  Zoster vaccine.** / 1 dose for adults aged 106 years or older.  Pneumococcal 13-valent conjugate (PCV13) vaccine.** / 1 dose for all adults aged 16 years and older.  Pneumococcal polysaccharide (PPSV23)  vaccine.** / 1 dose for all adults aged 29 years and older.  Meningococcal vaccine.** / Consult your health care provider.  Hepatitis A vaccine.** / Consult your health care provider.  Hepatitis B vaccine.** / Consult your health care provider.  Haemophilus influenzae type b (Hib) vaccine.** / Consult your health care provider. **Family history and personal history of risk and conditions may change your health care provider's recommendations.   This information is not intended to replace advice given to you by your health care provider. Make sure you discuss any questions you have with your health care provider.   Document Released: 01/20/2002 Document Revised: 12/15/2014 Document Reviewed: 04/21/2011 Elsevier Interactive Patient Education [employer].
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
## 2020-02-24 ENCOUNTER — Encounter: Payer: Self-pay | Admitting: Family Medicine

## 2020-02-27 LAB — TSH: TSH: 1.18 u[IU]/mL (ref 0.450–4.500)

## 2020-02-27 LAB — LIPID PANEL
Chol/HDL Ratio: 3.4 ratio (ref 0.0–5.0)
Cholesterol, Total: 119 mg/dL (ref 100–199)
HDL: 35 mg/dL — ABNORMAL LOW (ref >39)
LDL Chol Calc (NIH): 62 mg/dL (ref 0–99)
Triglycerides: 120 mg/dL (ref 0–149)
VLDL Cholesterol Cal: 22 mg/dL (ref 5–40)

## 2020-02-27 LAB — COMPREHENSIVE METABOLIC PANEL
ALT: 40 IU/L (ref 0–44)
AST: 28 IU/L (ref 0–40)
Albumin/Globulin Ratio: 1.8 (ref 1.2–2.2)
Albumin: 4.7 g/dL (ref 3.8–4.9)
Alkaline Phosphatase: 121 IU/L — ABNORMAL HIGH (ref 39–117)
BUN/Creatinine Ratio: 14 (ref 9–20)
BUN: 13 mg/dL (ref 6–24)
Bilirubin Total: 0.3 mg/dL (ref 0.0–1.2)
CO2: 24 mmol/L (ref 20–29)
Calcium: 9.5 mg/dL (ref 8.7–10.2)
Chloride: 103 mmol/L (ref 96–106)
Creatinine, Ser: 0.93 mg/dL (ref 0.76–1.27)
GFR calc Af Amer: 109 mL/min/{1.73_m2} (ref >59)
GFR calc non Af Amer: 95 mL/min/{1.73_m2} (ref >59)
Globulin, Total: 2.6 g/dL (ref 1.5–4.5)
Glucose: 101 mg/dL — ABNORMAL HIGH (ref 65–99)
Potassium: 4.9 mmol/L (ref 3.5–5.2)
Sodium: 141 mmol/L (ref 134–144)
Total Protein: 7.3 g/dL (ref 6.0–8.5)

## 2020-02-27 LAB — CBC
Hematocrit: 46.3 % (ref 37.5–51.0)
Hemoglobin: 15.9 g/dL (ref 13.0–17.7)
MCH: 28.4 pg (ref 26.6–33.0)
MCHC: 34.3 g/dL (ref 31.5–35.7)
MCV: 83 fL (ref 79–97)
Platelets: 330 10*3/uL (ref 150–450)
RBC: 5.59 x10E6/uL (ref 4.14–5.80)
RDW: 12.5 % (ref 11.6–15.4)
WBC: 10.6 10*3/uL (ref 3.4–10.8)

## 2020-02-27 LAB — HEMOGLOBIN A1C
Est. average glucose Bld gHb Est-mCnc: 114 mg/dL
Hgb A1c MFr Bld: 5.6 % (ref 4.8–5.6)

## 2020-02-27 LAB — VITAMIN D 25 HYDROXY (VIT D DEFICIENCY, FRACTURES): Vit D, 25-Hydroxy: 57.2 ng/mL (ref 30.0–100.0)

## 2020-02-27 LAB — PHOSPHORUS: Phosphorus: 3.5 mg/dL (ref 2.8–4.1)

## 2020-02-27 LAB — B12 AND FOLATE PANEL
Folate: 9.2 ng/mL (ref >3.0)
Vitamin B-12: 750 pg/mL (ref 232–1245)

## 2020-02-27 LAB — MAGNESIUM: Magnesium: 2.2 mg/dL (ref 1.6–2.3)

## 2020-02-27 LAB — TESTOSTERONE: Testosterone: 633 ng/dL (ref 264–916)

## 2020-02-27 LAB — T4, FREE: Free T4: 0.96 ng/dL (ref 0.82–1.77)

## 2020-02-27 LAB — VITAMIN B1: Thiamine: 154 nmol/L (ref 66.5–200.0)

## 2020-04-18 ENCOUNTER — Other Ambulatory Visit: Payer: Self-pay | Admitting: Physician Assistant

## 2020-04-18 DIAGNOSIS — Z8042 Family history of malignant neoplasm of prostate: Secondary | ICD-10-CM

## 2020-04-20 ENCOUNTER — Other Ambulatory Visit: Payer: BC Managed Care – PPO

## 2020-04-20 ENCOUNTER — Other Ambulatory Visit: Payer: Self-pay

## 2020-04-20 DIAGNOSIS — Z8042 Family history of malignant neoplasm of prostate: Secondary | ICD-10-CM

## 2020-04-21 LAB — PSA: Prostate Specific Ag, Serum: 0.7 ng/mL (ref 0.0–4.0)

## 2020-06-21 NOTE — Progress Notes (Signed)
Established Patient Office Visit  Subjective:  Patient ID: Patrick Avila, male    DOB: March 23, 1966  Age: 54 y.o. MRN: [id number]  CC:  Chief Complaint  Patient presents with  . Hypertension    HPI Patrick Avila presents for follow-up on hypertension and ED.  HTN: Pt denies new onset chest pain, palpitations, dizziness or lower extremity swelling. Taking medication as directed without side effects. Checks BP at home and readings range in 118-120/74.  He has made dietary changes and has an exercise regimen of weight lifting and using the elliptical, alternates between the two. He reports good hydration.  ED: Reports he has not tried medication yet. He has concerns of potential side effects.  Mood: Reports BuSpar is working well. He usually takes it twice daily, and if he knows he is going to have a stressful day he will take it 3 times daily.   History reviewed. No pertinent past medical history.  Past Surgical History:  Procedure Laterality Date  . TONSILLECTOMY  1975  . VASECTOMY  1999    Family History  Problem Relation Age of Onset  . Kidney cancer Mother   . Diabetes Mother   . Hypertension Mother   . Alcoholism Father   . Prostate cancer Father   . Diabetes Father   . Hypertension Father   . COPD Father   . Alcoholism Maternal Grandmother   . Diabetes Maternal Grandmother   . Alcoholism Maternal Grandfather   . Diabetes Maternal Grandfather     Social History   Socioeconomic History  . Marital status: Married    Spouse name: Not on file  . Number of children: Not on file  . Years of education: Not on file  . Highest education level: Not on file  Occupational History  . Not on file  Tobacco Use  . Smoking status: Former Smoker    Packs/day: 1.50    Years: 15.00    Pack years: 22.50    Types: Cigarettes    Quit date: 2006    Years since quitting: 15.6  . Smokeless tobacco: Current User    Types: Chew  . Tobacco comment: still smokes cigars  occ.   Vaping Use  . Vaping Use: Never used  Substance and Sexual Activity  . Alcohol use: Yes    Alcohol/week: 30.0 standard drinks    Types: 30 Standard drinks or equivalent per week  . Drug use: Not on file  . Sexual activity: Yes    Partners: Female    Birth control/protection: None  Other Topics Concern  . Not on file  Social History Narrative  . Not on file   Social Determinants of Health   Financial Resource Strain:   . Difficulty of Paying Living Expenses:   Food Insecurity:   . Worried About Charity fundraiser in the Last Year:   . Arboriculturist in the Last Year:   Transportation Needs:   . Film/video editor (Medical):   Marland Kitchen Lack of Transportation (Non-Medical):   Physical Activity:   . Days of Exercise per Week:   . Minutes of Exercise per Session:   Stress:   . Feeling of Stress :   Social Connections:   . Frequency of Communication with Friends and Family:   . Frequency of Social Gatherings with Friends and Family:   . Attends Religious Services:   . Active Member of Clubs or Organizations:   . Attends Archivist Meetings:   .  Marital Status:   Intimate Partner Violence:   . Fear of Current or Ex-Partner:   . Emotionally Abused:   Marland Kitchen Physically Abused:   . Sexually Abused:     Outpatient Medications Prior to Visit  Medication Sig Dispense Refill  . Cinnamon 500 MG capsule Take 1,000 mg by mouth daily.    Marland Kitchen glucosamine-chondroitin 500-400 MG tablet Take 1 tablet by mouth 3 (three) times daily.    Javier Docker Oil 350 MG CAPS Take 1 capsule by mouth daily.    Marland Kitchen loratadine (KLS ALLERCLEAR) 10 MG tablet Take 10 mg by mouth daily.    . Multiple Vitamin (ONE-A-DAY MENS PO) Take 1 tablet by mouth daily.    . sildenafil (REVATIO) 20 MG tablet Take 1 tablet (20 mg total) by mouth as needed. 20 tablet 0  . atorvastatin (LIPITOR) 20 MG tablet TAKE 1 TABLET (20 MG TOTAL) BY MOUTH AT BEDTIME. 90 tablet 1  . busPIRone (BUSPAR) 7.5 MG tablet Take 1 tablet  (7.5 mg total) by mouth 3 (three) times daily. 270 tablet 1  . fluticasone (FLONASE) 50 MCG/ACT nasal spray PLACE 1 SPRAY INTO EACH NOSTRIL FOLLOWING SINUS RINSES TWICE A DAY. 16 g 2  . losartan (COZAAR) 50 MG tablet TAKE 1 TABLET BY MOUTH DAILY. 90 tablet 3   No facility-administered medications prior to visit.    Allergies  Allergen Reactions  . Nylon Rash    sutures    ROS Review of Systems A fourteen system review of systems was performed and found to be positive as per HPI.   Objective:    Physical Exam General: Well nourished, in no apparent distress. Eyes: PERRLA, EOMs, conjunctiva clr Resp: Respiratory effort- normal, ECTA B/L w/o W/R/R  Cardio: RRR w/o MRGs. Abdomen: no gross distention. Lymphatics:  less 2 sec cap RF M-sk: Full ROM, good strength, normal gait.  Skin: Warm, dry  Neuro: Alert, Oriented Psych: Normal affect, Insight and Judgment appropriate.   BP 113/68   Pulse 86   Temp 98.3 F (36.8 C) (Oral)   Ht 6\' 1"  (1.854 m)   Wt 221 lb (100.2 kg)   SpO2 96% Comment: on RA  BMI 29.16 kg/m  Wt Readings from Last 3 Encounters:  06/22/20 221 lb (100.2 kg)  02/20/20 229 lb 1.6 oz (103.9 kg)  12/16/19 226 lb (102.5 kg)     Health Maintenance Due  Topic Date Due  . COLONOSCOPY  Never done  . INFLUENZA VACCINE  07/08/2020    There are no preventive care reminders to display for this patient.  Lab Results  Component Value Date   TSH 1.180 02/20/2020   Lab Results  Component Value Date   WBC 10.6 02/20/2020   HGB 15.9 02/20/2020   HCT 46.3 02/20/2020   MCV 83 02/20/2020   PLT 330 02/20/2020   Lab Results  Component Value Date   NA 141 02/20/2020   K 4.9 02/20/2020   CO2 24 02/20/2020   GLUCOSE 101 (H) 02/20/2020   BUN 13 02/20/2020   CREATININE 0.93 02/20/2020   BILITOT 0.3 02/20/2020   ALKPHOS 121 (H) 02/20/2020   AST 28 02/20/2020   ALT 40 02/20/2020   PROT 7.3 02/20/2020   ALBUMIN 4.7 02/20/2020   CALCIUM 9.5 02/20/2020   Lab  Results  Component Value Date   CHOL 119 02/20/2020   Lab Results  Component Value Date   HDL 35 (L) 02/20/2020   Lab Results  Component Value Date   LDLCALC 62  02/20/2020   Lab Results  Component Value Date   TRIG 120 02/20/2020   Lab Results  Component Value Date   CHOLHDL 3.4 02/20/2020   Lab Results  Component Value Date   HGBA1C 5.6 02/20/2020      Assessment & Plan:   Problem List Items Addressed This Visit      Cardiovascular and Mediastinum   Essential hypertension - Primary    - BP today is 137/83 HR 97, stable. - Continue Losartan 50 mg - Continue ambulatory BP and pulse monitoring. - Follow DASH diet. - Continue physical activity level.        Relevant Medications   atorvastatin (LIPITOR) 20 MG tablet   losartan (COZAAR) 50 MG tablet     Other   Mixed hyperlipidemia   Relevant Medications   atorvastatin (LIPITOR) 20 MG tablet   losartan (COZAAR) 50 MG tablet   Adjustment disorder with mixed anxiety and depressed mood   Relevant Medications   busPIRone (BUSPAR) 7.5 MG tablet   Environmental and seasonal allergies   Relevant Medications   fluticasone (FLONASE) 50 MCG/ACT nasal spray    Other Visit Diagnoses    Erectile dysfunction, unspecified erectile dysfunction type       Non-seasonal allergic rhinitis, unspecified trigger       Relevant Medications   fluticasone (FLONASE) 50 MCG/ACT nasal spray   Need for shingles vaccine       Relevant Orders   Varicella-zoster vaccine IM (Shingrix) (Completed)     Mixed hyperlipidemia: -Last lipid panel wnl with low HDL -Continue atrovastatin 20 mg. Provided refill. -Follow heart healthy diet. -Will continue to monitor.  Adjustment disorder with mixed anxiety and depressed mood: -Stable. -Continue current medication regimen. PHQ9 SCORE ONLY 06/22/2020 02/20/2020 12/16/2019  PHQ-9 Total Score 0 0 0   ED: -Discussed with patient ok to hold off on sildenafil. -Discussed risk factors for ED and  importance to reduce risk to improve symptoms. -Will continue to monitor.   Environmental and season allergies, allergic rhinitis: -Stable -Continue current medication regimen. Provided refill of Flonase.   Meds ordered this encounter  Medications  . atorvastatin (LIPITOR) 20 MG tablet    Sig: Take 1 tablet (20 mg total) by mouth at bedtime.    Dispense:  90 tablet    Refill:  1    Order Specific Question:   Supervising Provider    Answer:   Beatrice Lecher D [[id number]]  . busPIRone (BUSPAR) 7.5 MG tablet    Sig: Take 1 tablet (7.5 mg total) by mouth 3 (three) times daily.    Dispense:  270 tablet    Refill:  1    Order Specific Question:   Supervising Provider    Answer:   Beatrice Lecher D [[id number]]  . fluticasone (FLONASE) 50 MCG/ACT nasal spray    Sig: PLACE 1 SPRAY INTO EACH NOSTRIL FOLLOWING SINUS RINSES TWICE A DAY.    Dispense:  16 g    Refill:  2    Order Specific Question:   Supervising Provider    Answer:   Beatrice Lecher D [[id number]]  . losartan (COZAAR) 50 MG tablet    Sig: Take 1 tablet (50 mg total) by mouth daily.    Dispense:  90 tablet    Refill:  3    Order Specific Question:   Supervising Provider    Answer:   Beatrice Lecher D [[id number]]    Follow-up: Return in about 4 months (around 10/23/2020) for HTN, ED, Mood, HLD.  Lorrene Reid, PA-C

## 2020-06-22 ENCOUNTER — Ambulatory Visit: Payer: BC Managed Care – PPO | Admitting: Physician Assistant

## 2020-06-22 ENCOUNTER — Other Ambulatory Visit: Payer: Self-pay

## 2020-06-22 ENCOUNTER — Encounter: Payer: Self-pay | Admitting: Physician Assistant

## 2020-06-22 VITALS — BP 113/68 | HR 86 | Temp 98.3°F | Ht 73.0 in | Wt 221.0 lb

## 2020-06-22 DIAGNOSIS — J3089 Other allergic rhinitis: Secondary | ICD-10-CM

## 2020-06-22 DIAGNOSIS — N529 Male erectile dysfunction, unspecified: Secondary | ICD-10-CM

## 2020-06-22 DIAGNOSIS — Z23 Encounter for immunization: Secondary | ICD-10-CM

## 2020-06-22 DIAGNOSIS — I10 Essential (primary) hypertension: Secondary | ICD-10-CM

## 2020-06-22 DIAGNOSIS — F4323 Adjustment disorder with mixed anxiety and depressed mood: Secondary | ICD-10-CM | POA: Diagnosis not present

## 2020-06-22 DIAGNOSIS — E782 Mixed hyperlipidemia: Secondary | ICD-10-CM | POA: Diagnosis not present

## 2020-06-22 MED ORDER — LOSARTAN POTASSIUM 50 MG PO TABS: 50.0000 mg | ORAL_TABLET | Freq: Every day | ORAL | 3 refills | Status: DC

## 2020-06-22 MED ORDER — FLUTICASONE PROPIONATE 50 MCG/ACT NA SUSP: NASAL | 2 refills | Status: DC

## 2020-06-22 MED ORDER — BUSPIRONE HCL 7.5 MG PO TABS: 7.5000 mg | ORAL_TABLET | Freq: Three times a day (TID) | ORAL | 1 refills | Status: DC

## 2020-06-22 MED ORDER — ATORVASTATIN CALCIUM 20 MG PO TABS: 20.0000 mg | ORAL_TABLET | Freq: Every day | ORAL | 1 refills | Status: DC

## 2020-06-22 NOTE — Assessment & Plan Note (Addendum)
-   BP today is 137/83 HR 97, stable. - Continue Losartan 50 mg - Continue ambulatory BP and pulse monitoring. - Follow DASH diet. - Continue physical activity level.

## 2020-06-22 NOTE — Patient Instructions (Signed)
DASH Eating Plan DASH stands for "Dietary Approaches to Stop Hypertension." The DASH eating plan is a healthy eating plan that has been shown to reduce high blood pressure (hypertension). It may also reduce your risk for type 2 diabetes, heart disease, and stroke. The DASH eating plan may also help with weight loss. What are tips for following this plan?  General guidelines  Avoid eating more than 2,300 mg (milligrams) of salt (sodium) a day. If you have hypertension, you may need to reduce your sodium intake to 1,500 mg a day.  Limit alcohol intake to no more than 1 drink a day for nonpregnant women and 2 drinks a day for men. One drink equals 12 oz of beer, 5 oz of wine, or 1 oz of hard liquor.  Work with your health care provider to maintain a healthy body weight or to lose weight. Ask what an ideal weight is for you.  Get at least 30 minutes of exercise that causes your heart to beat faster (aerobic exercise) most days of the week. Activities may include walking, swimming, or biking.  Work with your health care provider or diet and nutrition specialist (dietitian) to adjust your eating plan to your individual calorie needs. Reading food labels   Check food labels for the amount of sodium per serving. Choose foods with less than 5 percent of the Daily Value of sodium. Generally, foods with less than 300 mg of sodium per serving fit into this eating plan.  To find whole grains, look for the word "whole" as the first word in the ingredient list. Shopping  Buy products labeled as "low-sodium" or "no salt added."  Buy fresh foods. Avoid canned foods and premade or frozen meals. Cooking  Avoid adding salt when cooking. Use salt-free seasonings or herbs instead of table salt or sea salt. Check with your health care provider or pharmacist before using salt substitutes.  Do not fry foods. Cook foods using healthy methods such as baking, boiling, grilling, and broiling instead.  Cook with  heart-healthy oils, such as olive, canola, soybean, or sunflower oil. Meal planning  Eat a balanced diet that includes: ? 5 or more servings of fruits and vegetables each day. At each meal, try to fill half of your plate with fruits and vegetables. ? Up to 6-8 servings of whole grains each day. ? Less than 6 oz of lean meat, poultry, or fish each day. A 3-oz serving of meat is about the same size as a deck of cards. One egg equals 1 oz. ? 2 servings of low-fat dairy each day. ? A serving of nuts, seeds, or beans 5 times each week. ? Heart-healthy fats. Healthy fats called Omega-3 fatty acids are found in foods such as flaxseeds and coldwater fish, like sardines, salmon, and mackerel.  Limit how much you eat of the following: ? Canned or prepackaged foods. ? Food that is high in trans fat, such as fried foods. ? Food that is high in saturated fat, such as fatty meat. ? Sweets, desserts, sugary drinks, and other foods with added sugar. ? Full-fat dairy products.  Do not salt foods before eating.  Try to eat at least 2 vegetarian meals each week.  Eat more home-cooked food and less restaurant, buffet, and fast food.  When eating at a restaurant, ask that your food be prepared with less salt or no salt, if possible. What foods are recommended? The items listed may not be a complete list. Talk with your dietitian about   what dietary choices are best for you. Grains Whole-grain or whole-wheat bread. Whole-grain or whole-wheat pasta. Brown rice. Oatmeal. Quinoa. Bulgur. Whole-grain and low-sodium cereals. Pita bread. Low-fat, low-sodium crackers. Whole-wheat flour tortillas. Vegetables Fresh or frozen vegetables (raw, steamed, roasted, or grilled). Low-sodium or reduced-sodium tomato and vegetable juice. Low-sodium or reduced-sodium tomato sauce and tomato paste. Low-sodium or reduced-sodium canned vegetables. Fruits All fresh, dried, or frozen fruit. Canned fruit in natural juice (without  added sugar). Meat and other protein foods Skinless chicken or turkey. Ground chicken or turkey. Pork with fat trimmed off. Fish and seafood. Egg whites. Dried beans, peas, or lentils. Unsalted nuts, nut butters, and seeds. Unsalted canned beans. Lean cuts of beef with fat trimmed off. Low-sodium, lean deli meat. Dairy Low-fat (1%) or fat-free (skim) milk. Fat-free, low-fat, or reduced-fat cheeses. Nonfat, low-sodium ricotta or cottage cheese. Low-fat or nonfat yogurt. Low-fat, low-sodium cheese. Fats and oils Soft margarine without trans fats. Vegetable oil. Low-fat, reduced-fat, or light mayonnaise and salad dressings (reduced-sodium). Canola, safflower, olive, soybean, and sunflower oils. Avocado. Seasoning and other foods Herbs. Spices. Seasoning mixes without salt. Unsalted popcorn and pretzels. Fat-free sweets. What foods are not recommended? The items listed may not be a complete list. Talk with your dietitian about what dietary choices are best for you. Grains Baked goods made with fat, such as croissants, muffins, or some breads. Dry pasta or rice meal packs. Vegetables Creamed or fried vegetables. Vegetables in a cheese sauce. Regular canned vegetables (not low-sodium or reduced-sodium). Regular canned tomato sauce and paste (not low-sodium or reduced-sodium). Regular tomato and vegetable juice (not low-sodium or reduced-sodium). Pickles. Olives. Fruits Canned fruit in a light or heavy syrup. Fried fruit. Fruit in cream or butter sauce. Meat and other protein foods Fatty cuts of meat. Ribs. Fried meat. Bacon. Sausage. Bologna and other processed lunch meats. Salami. Fatback. Hotdogs. Bratwurst. Salted nuts and seeds. Canned beans with added salt. Canned or smoked fish. Whole eggs or egg yolks. Chicken or turkey with skin. Dairy Whole or 2% milk, cream, and half-and-half. Whole or full-fat cream cheese. Whole-fat or sweetened yogurt. Full-fat cheese. Nondairy creamers. Whipped toppings.  Processed cheese and cheese spreads. Fats and oils Butter. Stick margarine. Lard. Shortening. Ghee. Bacon fat. Tropical oils, such as coconut, palm kernel, or palm oil. Seasoning and other foods Salted popcorn and pretzels. Onion salt, garlic salt, seasoned salt, table salt, and sea salt. Worcestershire sauce. Tartar sauce. Barbecue sauce. Teriyaki sauce. Soy sauce, including reduced-sodium. Steak sauce. Canned and packaged gravies. Fish sauce. Oyster sauce. Cocktail sauce. Horseradish that you find on the shelf. Ketchup. Mustard. Meat flavorings and tenderizers. Bouillon cubes. Hot sauce and Tabasco sauce. Premade or packaged marinades. Premade or packaged taco seasonings. Relishes. Regular salad dressings. Where to find more information:  National Heart, Lung, and Blood Institute: www.nhlbi.nih.gov  American Heart Association: www.heart.org Summary  The DASH eating plan is a healthy eating plan that has been shown to reduce high blood pressure (hypertension). It may also reduce your risk for type 2 diabetes, heart disease, and stroke.  With the DASH eating plan, you should limit salt (sodium) intake to 2,300 mg a day. If you have hypertension, you may need to reduce your sodium intake to 1,500 mg a day.  When on the DASH eating plan, aim to eat more fresh fruits and vegetables, whole grains, lean proteins, low-fat dairy, and heart-healthy fats.  Work with your health care provider or diet and nutrition specialist (dietitian) to adjust your eating plan to your   individual calorie needs. This information is not intended to replace advice given to you by your health care provider. Make sure you discuss any questions you have with your health care provider. Document Revised: 11/06/2017 Document Reviewed: 11/17/2016 Elsevier Patient Education  2020 [employer]  

## 2020-07-06 ENCOUNTER — Other Ambulatory Visit: Payer: Self-pay | Admitting: Family Medicine

## 2020-07-06 DIAGNOSIS — I10 Essential (primary) hypertension: Secondary | ICD-10-CM

## 2020-10-11 ENCOUNTER — Other Ambulatory Visit: Payer: Self-pay | Admitting: Physician Assistant

## 2020-10-11 DIAGNOSIS — J3089 Other allergic rhinitis: Secondary | ICD-10-CM

## 2020-10-25 ENCOUNTER — Ambulatory Visit: Payer: BC Managed Care – PPO | Admitting: Physician Assistant

## 2020-10-25 ENCOUNTER — Encounter: Payer: Self-pay | Admitting: Physician Assistant

## 2020-10-25 ENCOUNTER — Other Ambulatory Visit: Payer: Self-pay

## 2020-10-25 VITALS — BP 126/73 | HR 88 | Ht 73.0 in | Wt 211.8 lb

## 2020-10-25 DIAGNOSIS — N529 Male erectile dysfunction, unspecified: Secondary | ICD-10-CM

## 2020-10-25 DIAGNOSIS — E782 Mixed hyperlipidemia: Secondary | ICD-10-CM | POA: Diagnosis not present

## 2020-10-25 DIAGNOSIS — I10 Essential (primary) hypertension: Secondary | ICD-10-CM | POA: Diagnosis not present

## 2020-10-25 DIAGNOSIS — G250 Essential tremor: Secondary | ICD-10-CM

## 2020-10-25 DIAGNOSIS — F4323 Adjustment disorder with mixed anxiety and depressed mood: Secondary | ICD-10-CM | POA: Diagnosis not present

## 2020-10-25 MED ORDER — SILDENAFIL CITRATE 20 MG PO TABS: 20.0000 mg | ORAL_TABLET | ORAL | 0 refills | Status: DC | PRN

## 2020-10-25 MED ORDER — BUSPIRONE HCL 10 MG PO TABS: 10.0000 mg | ORAL_TABLET | Freq: Three times a day (TID) | ORAL | 0 refills | Status: DC

## 2020-10-25 NOTE — Patient Instructions (Signed)

## 2020-10-25 NOTE — Progress Notes (Signed)
Established Patient Office Visit  Subjective:  Patient ID: Patrick Avila, male    DOB: 1966-08-23  Age: 54 y.o. MRN: [id number]  CC:  Chief Complaint  Patient presents with  . Hypertension  . Hyperlipidemia    HPI Patrick Avila presents for follow up on hypertension and hyperlipidemia. Reports has noticed head tremors are more pronounced usually occur when he feels anxious, with rotational movements or when trying to keep his head still.  HTN: Pt denies chest pain, palpitations, dizziness or lower extremity swelling. Taking medication as directed without side effects. Checks BP at home and readings average 110-120s/70s. Pt reports good hydration.  HLD: Pt taking medication as directed without issues. Denies side effects including. Has made dietary changes in an effort to lose weight and improve cholesterol. Stays active with cardio and weight lifting. No family history of heart disease.  ED: Requesting refill of medication. States has noticed decreased libido, which feels like it is possibly a medication side effect because had no prior issues until starting antihypertensive and statin medications. Went on vacation and forgot his medications so before he was able to get them he noticed he had no issues with libido.     History reviewed. No pertinent past medical history.  Past Surgical History:  Procedure Laterality Date  . TONSILLECTOMY  1975  . VASECTOMY  1999    Family History  Problem Relation Age of Onset  . Kidney cancer Mother   . Diabetes Mother   . Hypertension Mother   . Alcoholism Father   . Prostate cancer Father   . Diabetes Father   . Hypertension Father   . COPD Father   . Alcoholism Maternal Grandmother   . Diabetes Maternal Grandmother   . Alcoholism Maternal Grandfather   . Diabetes Maternal Grandfather     Social History   Socioeconomic History  . Marital status: Married    Spouse name: Not on file  . Number of children: Not on file   . Years of education: Not on file  . Highest education level: Not on file  Occupational History  . Not on file  Tobacco Use  . Smoking status: Former Smoker    Packs/day: 1.50    Years: 15.00    Pack years: 22.50    Types: Cigarettes    Quit date: 2006    Years since quitting: 15.8  . Smokeless tobacco: Current User    Types: Chew  . Tobacco comment: still smokes cigars occ.   Vaping Use  . Vaping Use: Never used  Substance and Sexual Activity  . Alcohol use: Yes    Alcohol/week: 30.0 standard drinks    Types: 30 Standard drinks or equivalent per week  . Drug use: Not on file  . Sexual activity: Yes    Partners: Female    Birth control/protection: None  Other Topics Concern  . Not on file  Social History Narrative  . Not on file   Social Determinants of Health   Financial Resource Strain:   . Difficulty of Paying Living Expenses: Not on file  Food Insecurity:   . Worried About Charity fundraiser in the Last Year: Not on file  . Ran Out of Food in the Last Year: Not on file  Transportation Needs:   . Lack of Transportation (Medical): Not on file  . Lack of Transportation (Non-Medical): Not on file  Physical Activity:   . Days of Exercise per Week: Not on file  . Minutes  of Exercise per Session: Not on file  Stress:   . Feeling of Stress : Not on file  Social Connections:   . Frequency of Communication with Friends and Family: Not on file  . Frequency of Social Gatherings with Friends and Family: Not on file  . Attends Religious Services: Not on file  . Active Member of Clubs or Organizations: Not on file  . Attends Archivist Meetings: Not on file  . Marital Status: Not on file  Intimate Partner Violence:   . Fear of Current or Ex-Partner: Not on file  . Emotionally Abused: Not on file  . Physically Abused: Not on file  . Sexually Abused: Not on file    Outpatient Medications Prior to Visit  Medication Sig Dispense Refill  . atorvastatin  (LIPITOR) 20 MG tablet Take 1 tablet (20 mg total) by mouth at bedtime. 90 tablet 1  . Cinnamon 500 MG capsule Take 1,000 mg by mouth daily.    . fluticasone (FLONASE) 50 MCG/ACT nasal spray PLACE 1 SPRAY INTO EACH NOSTRIL FOLLOWING SINUS RINSES TWICE A DAY. 16 g 1  . glucosamine-chondroitin 500-400 MG tablet Take 1 tablet by mouth 3 (three) times daily.    Javier Docker Oil 350 MG CAPS Take 1 capsule by mouth daily.    Marland Kitchen loratadine (KLS ALLERCLEAR) 10 MG tablet Take 10 mg by mouth daily.    Marland Kitchen losartan (COZAAR) 50 MG tablet Take 1 tablet (50 mg total) by mouth daily. 90 tablet 3  . Multiple Vitamin (ONE-A-DAY MENS PO) Take 1 tablet by mouth daily.    . busPIRone (BUSPAR) 7.5 MG tablet Take 1 tablet (7.5 mg total) by mouth 3 (three) times daily. 270 tablet 1  . sildenafil (REVATIO) 20 MG tablet Take 1 tablet (20 mg total) by mouth as needed. 20 tablet 0   No facility-administered medications prior to visit.    Allergies  Allergen Reactions  . Nylon Rash    sutures    ROS Review of Systems A fourteen system review of systems was performed and found to be positive as per HPI.  Objective:    Physical Exam General:  Well Developed, well nourished, appropriate for stated age.  Neuro:  Alert and oriented,  extra-ocular muscles intact, head tremor noted  HEENT:  Normocephalic, atraumatic, neck supple Skin:  no gross rash, warm, pink. Cardiac:  RRR, S1 S2, no murmur Respiratory:  ECTA B/L, Not using accessory muscles, speaking in full sentences- unlabored. Vascular:  Ext warm, no cyanosis apprec.; cap RF less 2 sec. Psych:  No HI/SI, judgement and insight good, Euthymic mood. Full Affect.  BP 126/73   Pulse 88   Ht 6\' 1"  (1.854 m)   Wt 211 lb 12.8 oz (96.1 kg)   SpO2 98%   BMI 27.94 kg/m  Wt Readings from Last 3 Encounters:  10/25/20 211 lb 12.8 oz (96.1 kg)  06/22/20 221 lb (100.2 kg)  02/20/20 229 lb 1.6 oz (103.9 kg)     Health Maintenance Due  Topic Date Due  . COLONOSCOPY   Never done  . INFLUENZA VACCINE  07/08/2020    There are no preventive care reminders to display for this patient.  Lab Results  Component Value Date   TSH 1.180 02/20/2020   Lab Results  Component Value Date   WBC 10.6 02/20/2020   HGB 15.9 02/20/2020   HCT 46.3 02/20/2020   MCV 83 02/20/2020   PLT 330 02/20/2020   Lab Results  Component Value  Date   NA 141 02/20/2020   K 4.9 02/20/2020   CO2 24 02/20/2020   GLUCOSE 101 (H) 02/20/2020   BUN 13 02/20/2020   CREATININE 0.93 02/20/2020   BILITOT 0.3 02/20/2020   ALKPHOS 121 (H) 02/20/2020   AST 28 02/20/2020   ALT 40 02/20/2020   PROT 7.3 02/20/2020   ALBUMIN 4.7 02/20/2020   CALCIUM 9.5 02/20/2020   Lab Results  Component Value Date   CHOL 119 02/20/2020   Lab Results  Component Value Date   HDL 35 (L) 02/20/2020   Lab Results  Component Value Date   LDLCALC 62 02/20/2020   Lab Results  Component Value Date   TRIG 120 02/20/2020   Lab Results  Component Value Date   CHOLHDL 3.4 02/20/2020   Lab Results  Component Value Date   HGBA1C 5.6 02/20/2020      Assessment & Plan:   Problem List Items Addressed This Visit      Cardiovascular and Mediastinum   Essential hypertension - Primary   Relevant Medications   sildenafil (REVATIO) 20 MG tablet     Other   Mixed hyperlipidemia   Relevant Medications   sildenafil (REVATIO) 20 MG tablet   Adjustment disorder with mixed anxiety and depressed mood    Other Visit Diagnoses    Benign head tremor       Erectile dysfunction, unspecified erectile dysfunction type       Relevant Medications   sildenafil (REVATIO) 20 MG tablet     Essential hypertension: -BP at goal. -Continue current medication regimen. -Discussed with patient if continues with weight loss efforts, dietary and lifestyle changes and blood pressure continues to remain at goal it is reasonable to trial coming off medication. -Last CMP renal function and electrolytes within normal  limits. -Will continue monitor.  Mixed hyperlipidemia: -Last lipid panel: Total cholesterol 119, triglycerides 120, HDL 35, LDL 62 -Continue current medication regimen. -Follow heart healthy diet low in saturated and transfats.  Continue to stay active. -Will continue to monitor.  Plan to repeat lipid panel and hepatic function with CPE.  Adjustment disorder with mixed anxiety and depressed mood: -PHQ-9 score of 0 -Discussed with patient adjusting BuSpar dose to help improve anxiety. Patient is agreeable to increasing to 10 mg.  Advised to let me know if unable to tolerate increased dose.  Patient verbalized understanding. -Will continue to monitor.  Benign head tremor: -Discussed with patient possible etiologies such as cervical dystonia or essential tremor. -Patient prefers to try the increased dose of Buspar to see if episodes reduce with improved anxiety before considering neurology consultation, which is reasonable.  -Recommend to reduce possible triggers such as caffeine or stress.  ED: -Discussed with patient decreased libido could possibly be a medication side effect and we could trial discontinuing some of his medications if blood pressure and hyperlipidemia are well controlled to see if symptoms improve. -Continue current medication regimen. Provided refill.   Meds ordered this encounter  Medications  . sildenafil (REVATIO) 20 MG tablet    Sig: Take 1 tablet (20 mg total) by mouth as needed.    Dispense:  20 tablet    Refill:  0  . busPIRone (BUSPAR) 10 MG tablet    Sig: Take 1 tablet (10 mg total) by mouth 3 (three) times daily.    Dispense:  270 tablet    Refill:  0    Follow-up: Return in about 4 months (around 02/22/2021) for CPE and FBW.   Note:  This note was prepared with assistance of Systems analyst. Occasional wrong-word or sound-a-like substitutions may have occurred due to the inherent limitations of voice recognition software.  Lorrene Reid, PA-C

## 2020-12-26 ENCOUNTER — Other Ambulatory Visit: Payer: Self-pay | Admitting: Physician Assistant

## 2020-12-26 DIAGNOSIS — E782 Mixed hyperlipidemia: Secondary | ICD-10-CM

## 2021-02-08 ENCOUNTER — Other Ambulatory Visit: Payer: Self-pay | Admitting: Physician Assistant

## 2021-02-08 DIAGNOSIS — Z0000 Encounter for general adult medical examination without abnormal findings: Secondary | ICD-10-CM

## 2021-02-08 DIAGNOSIS — E782 Mixed hyperlipidemia: Secondary | ICD-10-CM

## 2021-02-08 DIAGNOSIS — I10 Essential (primary) hypertension: Secondary | ICD-10-CM

## 2021-02-13 ENCOUNTER — Other Ambulatory Visit: Payer: BC Managed Care – PPO

## 2021-02-13 ENCOUNTER — Other Ambulatory Visit: Payer: Self-pay

## 2021-02-13 DIAGNOSIS — E782 Mixed hyperlipidemia: Secondary | ICD-10-CM

## 2021-02-13 DIAGNOSIS — I10 Essential (primary) hypertension: Secondary | ICD-10-CM

## 2021-02-13 DIAGNOSIS — Z0000 Encounter for general adult medical examination without abnormal findings: Secondary | ICD-10-CM

## 2021-02-15 LAB — CBC
Hematocrit: 46.9 % (ref 37.5–51.0)
Hemoglobin: 15.6 g/dL (ref 13.0–17.7)
MCH: 27.9 pg (ref 26.6–33.0)
MCHC: 33.3 g/dL (ref 31.5–35.7)
MCV: 84 fL (ref 79–97)
Platelets: 246 10*3/uL (ref 150–450)
RBC: 5.6 x10E6/uL (ref 4.14–5.80)
RDW: 12.4 % (ref 11.6–15.4)
WBC: 9.3 10*3/uL (ref 3.4–10.8)

## 2021-02-15 LAB — LIPID PANEL
Chol/HDL Ratio: 3.4 ratio (ref 0.0–5.0)
Cholesterol, Total: 108 mg/dL (ref 100–199)
HDL: 32 mg/dL — ABNORMAL LOW (ref >39)
LDL Chol Calc (NIH): 56 mg/dL (ref 0–99)
Triglycerides: 106 mg/dL (ref 0–149)
VLDL Cholesterol Cal: 20 mg/dL (ref 5–40)

## 2021-02-15 LAB — COMPREHENSIVE METABOLIC PANEL
ALT: 30 IU/L (ref 0–44)
AST: 28 IU/L (ref 0–40)
Albumin/Globulin Ratio: 2.4 — ABNORMAL HIGH (ref 1.2–2.2)
Albumin: 4.7 g/dL (ref 3.8–4.9)
Alkaline Phosphatase: 97 IU/L (ref 44–121)
BUN/Creatinine Ratio: 13 (ref 9–20)
BUN: 13 mg/dL (ref 6–24)
Bilirubin Total: 0.5 mg/dL (ref 0.0–1.2)
CO2: 21 mmol/L (ref 20–29)
Calcium: 9.4 mg/dL (ref 8.7–10.2)
Chloride: 100 mmol/L (ref 96–106)
Creatinine, Ser: 1.04 mg/dL (ref 0.76–1.27)
Globulin, Total: 2 g/dL (ref 1.5–4.5)
Glucose: 96 mg/dL (ref 65–99)
Potassium: 4.7 mmol/L (ref 3.5–5.2)
Sodium: 138 mmol/L (ref 134–144)
Total Protein: 6.7 g/dL (ref 6.0–8.5)
eGFR: 86 mL/min/{1.73_m2} (ref >59)

## 2021-02-15 LAB — TSH: TSH: 0.992 u[IU]/mL (ref 0.450–4.500)

## 2021-02-15 LAB — HEMOGLOBIN A1C
Est. average glucose Bld gHb Est-mCnc: 111 mg/dL
Hgb A1c MFr Bld: 5.5 % (ref 4.8–5.6)

## 2021-02-20 ENCOUNTER — Ambulatory Visit (INDEPENDENT_AMBULATORY_CARE_PROVIDER_SITE_OTHER): Payer: BC Managed Care – PPO | Admitting: Physician Assistant

## 2021-02-20 ENCOUNTER — Other Ambulatory Visit: Payer: Self-pay

## 2021-02-20 VITALS — BP 111/72 | HR 82 | Temp 96.7°F | Ht 73.0 in | Wt 213.7 lb

## 2021-02-20 DIAGNOSIS — G250 Essential tremor: Secondary | ICD-10-CM

## 2021-02-20 DIAGNOSIS — Z0000 Encounter for general adult medical examination without abnormal findings: Secondary | ICD-10-CM

## 2021-02-20 DIAGNOSIS — Z1211 Encounter for screening for malignant neoplasm of colon: Secondary | ICD-10-CM | POA: Diagnosis not present

## 2021-02-20 DIAGNOSIS — E782 Mixed hyperlipidemia: Secondary | ICD-10-CM | POA: Diagnosis not present

## 2021-02-20 DIAGNOSIS — I10 Essential (primary) hypertension: Secondary | ICD-10-CM

## 2021-02-20 DIAGNOSIS — Z23 Encounter for immunization: Secondary | ICD-10-CM

## 2021-02-20 NOTE — Progress Notes (Signed)
Male physical   Impression and Recommendations:    1. Healthcare maintenance   2. Essential hypertension   3. Mixed hyperlipidemia   4. Screening for colon cancer   5. Benign head tremor   6. Need for shingles vaccine      1) Anticipatory Guidance: Skin CA prevention- recommend to use sunscreen when outside along with skin surveillance; eating a balanced and modest diet; physical activity at least 25 minutes per day or minimum of 150 min/ week moderate to intense activity.  2) Immunizations / Screenings / Labs:   All immunizations are up-to-date per recommendations or will be updated today if pt allows.    - Patient understands with dental and vision screens they will schedule independently.  - Obtained CBC, CMP, HgA1c, Lipid panel, TSH when fasting, if not already done past 12 mo/ recently. Most labs are essentially within normal limits or stable from prior. -UTD on tdap, Hep C and HIV screenings, influenza and Covid vaccines. -Will place new order for screening colonoscopy.  -Patient will obtain second dose of Shingrix today.  3) Weight:  Continue to improve diet habits to improve overall feelings of well being and objective health data. Improve nutrient density of diet through increasing intake of fruits and vegetables and decreasing saturated fats, white flour products and refined sugars.   4) Healthcare Maintenance: -Patient concerned about head tremor and possibly eye drifting and prefers to proceed with Neurology referral so will place order. Discussed with patient head tremors usually benign.  -BP and hyperlipidemia are well controlled and patient wants to trial managing with diet and lifestyle and discontinue medications (atorvastatin and losartan). Patient is nondiabetic so reasonable to trial. Advised to monitor BP at home, goal is <130/80 and should notify the clinic if BP not at goal to restart medication. Will repeat lipid panel at follow up visit and if not well  controlled then recommend resuming statin therapy. Patient verbalized understanding and agreeable. -Follow a heart healthy diet, monitor sodium, stay well hydrated and continue exercise regimen. -Follow up in 4 months for HTN, HLD and FBW (lipid panel, cmp)   Orders Placed This Encounter  Procedures  . Varicella-zoster vaccine IM (Shingrix)  . Ambulatory referral to Gastroenterology    Referral Priority:   Routine    Referral Type:   Consultation    Referral Reason:   Specialty Services Required    Number of Visits Requested:   1  . Ambulatory referral to Neurology    Referral Priority:   Routine    Referral Type:   Consultation    Referral Reason:   Specialty Services Required    Requested Specialty:   Neurology    Number of Visits Requested:   1    No orders of the defined types were placed in this encounter.    Return in about 4 months (around 06/22/2021) for HTN, HLD and FBW (lipid panel, cmp).    Gross side effects, risk and benefits, and alternatives of medications discussed with patient.  Patient is aware that all medications have potential side effects and we are unable to predict every side effect or drug-drug interaction that may occur.  Expresses verbal understanding and consents to current therapy plan and treatment regimen.  Please see AVS handed out to patient at the end of our visit for further patient instructions/ counseling done pertaining to today's office visit.       Subjective:        CC: CPE  HPI: Patrick Avila is a 55 y.o. male who presents to [facility] today for a yearly health maintenance exam.     Health Maintenance Summary  - Reviewed and updated, unless pt declines services.  Last Cologuard or Colonoscopy:   Placed order. Family history of Colon CA: N  Tobacco History Reviewed:  Y, former smoker  Abdominal Ultrasound:  N/A CT scan for screening lung CA: N/A Alcohol / drug use:  No concerns, no  longer use (quit 2019)/ no use Dental Home: Y  Eye exams: N Male history: STD concerns:   none Additional penile/ urinary concerns:  none     Immunization History  Administered Date(s) Administered  . Influenza,inj,Quad PF,6+ Mos 10/03/2015, 11/03/2018  . Influenza,inj,quad, With Preservative 10/03/2015  . Influenza-Unspecified 09/15/2019, 09/07/2020  . PFIZER(Purple Top)SARS-COV-2 Vaccination 02/11/2020, 03/03/2020, 10/28/2020  . Tdap 07/26/2018  . Zoster Recombinat (Shingrix) 06/22/2020, 02/22/2021     Health Maintenance  Topic Date Due  . COLONOSCOPY (Pts 45-41yrs Insurance coverage will need to be confirmed)  Never done  . TETANUS/TDAP  07/26/2028  . INFLUENZA VACCINE  Completed  . COVID-19 Vaccine  Completed  . Hepatitis C Screening  Completed  . HIV Screening  Completed  . HPV VACCINES  Aged Out       Wt Readings from Last 3 Encounters:  02/20/21 213 lb 11.2 oz (96.9 kg)  10/25/20 211 lb 12.8 oz (96.1 kg)  06/22/20 221 lb (100.2 kg)   BP Readings from Last 3 Encounters:  02/20/21 111/72  10/25/20 126/73  06/22/20 113/68   Pulse Readings from Last 3 Encounters:  02/20/21 82  10/25/20 88  06/22/20 86    Patient Active Problem List   Diagnosis Date Noted  . Overweight (BMI 25.0-29.9) 12/25/2019  . Environmental and seasonal allergies 08/06/2019  . Adjustment disorder with mixed anxiety and depressed mood 09/16/2018  . Plantar fasciitis, left 09/16/2018  . Elevated liver enzymes 08/29/2018  . Essential hypertension 07/26/2018  . Hypertriglyceridemia 07/26/2018  . Mixed hyperlipidemia 07/26/2018  . Low level of high density lipoprotein (HDL) 07/26/2018  . Elevated blood pressure reading 06/22/2018  . h/o Excessive drinking of alcohol 06/22/2018  . History of smoking 25-50 pack years 06/22/2018  . Family history of diabetes mellitus 06/22/2018  . Family history of essential hypertension 06/22/2018  . Family history of prostate cancer in father  06/22/2018  . Family history of alcoholism 06/22/2018  . Family history of combined hyperlipidemia 06/22/2018    Past Medical History:  Diagnosis Date  . Anxiety    Phreesia 02/19/2021  . Hyperlipidemia    Phreesia 02/19/2021  . Hypertension    Phreesia 02/19/2021    Past Surgical History:  Procedure Laterality Date  . TONSILLECTOMY  1975  . VASECTOMY  1999    Family History  Problem Relation Age of Onset  . Kidney cancer Mother   . Diabetes Mother   . Hypertension Mother   . Alcoholism Father   . Prostate cancer Father   . Diabetes Father   . Hypertension Father   . COPD Father   . Alcoholism Maternal Grandmother   . Diabetes Maternal Grandmother   . Alcoholism Maternal Grandfather   . Diabetes Maternal Grandfather     Social History   Substance and Sexual Activity  Drug Use Not on file  ,  Social History   Substance and Sexual Activity  Alcohol Use Yes  . Alcohol/week: 30.0 standard drinks  . Types: 30 Standard  drinks or equivalent per week  ,  Social History   Tobacco Use  Smoking Status Former Smoker  . Packs/day: 1.50  . Years: 15.00  . Pack years: 22.50  . Types: Cigarettes  . Quit date: 2006  . Years since quitting: 16.2  Smokeless Tobacco Current User  . Types: Chew  Tobacco Comment   still smokes cigars occ.   ,  Social History   Substance and Sexual Activity  Sexual Activity Yes  . Partners: Female  . Birth control/protection: None    Patient's Medications  New Prescriptions   No medications on file  Previous Medications   BUSPIRONE (BUSPAR) 10 MG TABLET    Take 1 tablet (10 mg total) by mouth 3 (three) times daily.   CINNAMON 500 MG CAPSULE    Take 1,000 mg by mouth daily.   FLUTICASONE (FLONASE) 50 MCG/ACT NASAL SPRAY    PLACE 1 SPRAY INTO EACH NOSTRIL FOLLOWING SINUS RINSES TWICE A DAY.   GLUCOSAMINE-CHONDROITIN 500-400 MG TABLET    Take 1 tablet by mouth 3 (three) times daily.   KRILL OIL 350 MG CAPS    Take 1 capsule by  mouth daily.   LORATADINE (CLARITIN) 10 MG TABLET    Take 10 mg by mouth daily.   MULTIPLE VITAMIN (ONE-A-DAY MENS PO)    Take 1 tablet by mouth daily.   SILDENAFIL (REVATIO) 20 MG TABLET    Take 1 tablet (20 mg total) by mouth as needed.  Modified Medications   No medications on file  Discontinued Medications   ATORVASTATIN (LIPITOR) 20 MG TABLET    TAKE 1 TABLET BY MOUTH AT BEDTIME.   LOSARTAN (COZAAR) 50 MG TABLET    Take 1 tablet (50 mg total) by mouth daily.    Nylon and Other  Review of Systems: General:   Denies fever, chills, unexplained weight loss.  Optho/Auditory:   Denies visual changes, blurred vision/LOV Respiratory:   Denies SOB, DOE more than baseline levels.   Cardiovascular:   Denies chest pain, palpitations, new onset peripheral edema  Gastrointestinal:   Denies nausea, vomiting, diarrhea.  Genitourinary: Denies dysuria, freq/ urgency, flank pain or discharge from genitals.  Endocrine:     Denies hot or cold intolerance, polyuria, polydipsia. Musculoskeletal:   Denies unexplained myalgias, joint swelling, unexplained arthralgias, gait problems.  Skin:  Denies rash, suspicious lesions Neurological:     Denies dizziness, unexplained weakness, numbness  Psychiatric/Behavioral:   Denies mood changes, suicidal or homicidal ideations, hallucinations    Objective:     Blood pressure 111/72, pulse 82, temperature (!) 96.7 F (35.9 C), height 6\' 1"  (1.854 m), weight 213 lb 11.2 oz (96.9 kg), SpO2 97 %. Body mass index is 28.19 kg/m. General Appearance:    Alert, cooperative, no distress, appears stated age  Head:    Normocephalic, without obvious abnormality, atraumatic  Eyes:    PERRL, conjunctiva/corneas clear, EOM's intact, both eyes  Ears:    Normal TM's and external ear canals, both ears  Nose:   Nares normal, septum midline, mucosa normal, no drainage    or sinus tenderness  Throat:   Lips w/o lesion, mucosa moist, and tongue normal; teeth and gums fair   Neck:   Supple, symmetrical, trachea midline, no adenopathy;    thyroid:  no enlargement/tenderness/nodules; no carotid   bruit or JVD  Back:     Symmetric, no curvature, ROM normal, no CVA tenderness  Lungs:     Clear to auscultation bilaterally, respirations unlabored, no  Wh/ R/ R  Chest Wall:    No tenderness or gross deformity; normal excursion   Heart:    Regular rate and rhythm, S1 and S2 normal, no murmur, rub   or gallop  Abdomen:     Soft, non-tender, bowel sounds active all four quadrants, No G/R/R, no masses, no organomegaly  Genitalia:   Deferred. No concerns/sxs.  Rectal:   Deferred. No concerns/sxs.  Extremities:   Extremities normal, atraumatic, no cyanosis or gross edema  Pulses:   2+ and symmetric all extremities  Skin:   Warm, dry, Skin color, texture, turgor normal, no obvious rashes or lesions  M-Sk:   Ambulates * 4 w/o difficulty, no gross deformities, tone WNL  Neurologic:   CNII-XII grossly intact, +head tremor Psych:  No HI/SI, judgement and insight good, Euthymic mood. Full Affect.

## 2021-02-20 NOTE — Patient Instructions (Signed)
Heart-Healthy Eating Plan Heart-healthy meal planning includes:  Eating less unhealthy fats.  Eating more healthy fats.  Making other changes in your diet. Talk with your doctor or a diet specialist (dietitian) to create an eating plan that is right for you. What is my plan? Your doctor may recommend an eating plan that includes:  Total fat: ______% or less of total calories a day.  Saturated fat: ______% or less of total calories a day.  Cholesterol: less than _________mg a day. What are tips for following this plan? Cooking Avoid frying your food. Try to bake, boil, grill, or broil it instead. You can also reduce fat by:  Removing the skin from poultry.  Removing all visible fats from meats.  Steaming vegetables in water or broth. Meal planning  At meals, divide your plate into four equal parts: ? Fill one-half of your plate with vegetables and green salads. ? Fill one-fourth of your plate with whole grains. ? Fill one-fourth of your plate with lean protein foods.  Eat 4-5 servings of vegetables per day. A serving of vegetables is: ? 1 cup of raw or cooked vegetables. ? 2 cups of raw leafy greens.  Eat 4-5 servings of fruit per day. A serving of fruit is: ? 1 medium whole fruit. ?  cup of dried fruit. ?  cup of fresh, frozen, or canned fruit. ?  cup of 100% fruit juice.  Eat more foods that have soluble fiber. These are apples, broccoli, carrots, beans, peas, and barley. Try to get 20-30 g of fiber per day.  Eat 4-5 servings of nuts, legumes, and seeds per week: ? 1 serving of dried beans or legumes equals  cup after being cooked. ? 1 serving of nuts is  cup. ? 1 serving of seeds equals 1 tablespoon.   General information  Eat more home-cooked food. Eat less restaurant, buffet, and fast food.  Limit or avoid alcohol.  Limit foods that are high in starch and sugar.  Avoid fried foods.  Lose weight if you are overweight.  Keep track of how much salt  (sodium) you eat. This is important if you have high blood pressure. Ask your doctor to tell you more about this.  Try to add vegetarian meals each week. Fats  Choose healthy fats. These include olive oil and canola oil, flaxseeds, walnuts, almonds, and seeds.  Eat more omega-3 fats. These include salmon, mackerel, sardines, tuna, flaxseed oil, and ground flaxseeds. Try to eat fish at least 2 times each week.  Check food labels. Avoid foods with trans fats or high amounts of saturated fat.  Limit saturated fats. ? These are often found in animal products, such as meats, butter, and cream. ? These are also found in plant foods, such as palm oil, palm kernel oil, and coconut oil.  Avoid foods with partially hydrogenated oils in them. These have trans fats. Examples are stick margarine, some tub margarines, cookies, crackers, and other baked goods. What foods can I eat? Fruits All fresh, canned (in natural juice), or frozen fruits. Vegetables Fresh or frozen vegetables (raw, steamed, roasted, or grilled). Green salads. Grains Most grains. Choose whole wheat and whole grains most of the time. Rice and pasta, including brown rice and pastas made with whole wheat. Meats and other proteins Lean, well-trimmed beef, veal, pork, and lamb. Chicken and turkey without skin. All fish and shellfish. Wild duck, rabbit, pheasant, and venison. Egg whites or low-cholesterol egg substitutes. Dried beans, peas, lentils, and tofu. Seeds and   most nuts. Dairy Low-fat or nonfat cheeses, including ricotta and mozzarella. Skim or 1% milk that is liquid, powdered, or evaporated. Buttermilk that is made with low-fat milk. Nonfat or low-fat yogurt. Fats and oils Non-hydrogenated (trans-free) margarines. Vegetable oils, including soybean, sesame, sunflower, olive, peanut, safflower, corn, canola, and cottonseed. Salad dressings or mayonnaise made with a vegetable oil. Beverages Mineral water. Coffee and tea. Diet  carbonated beverages. Sweets and desserts Sherbet, gelatin, and fruit ice. Small amounts of dark chocolate. Limit all sweets and desserts. Seasonings and condiments All seasonings and condiments. The items listed above may not be a complete list of foods and drinks you can eat. Contact a dietitian for more options. What foods should I avoid? Fruits Canned fruit in heavy syrup. Fruit in cream or butter sauce. Fried fruit. Limit coconut. Vegetables Vegetables cooked in cheese, cream, or butter sauce. Fried vegetables. Grains Breads that are made with saturated or trans fats, oils, or whole milk. Croissants. Sweet rolls. Donuts. High-fat crackers, such as cheese crackers. Meats and other proteins Fatty meats, such as hot dogs, ribs, sausage, bacon, rib-eye roast or steak. High-fat deli meats, such as salami and bologna. Caviar. Domestic duck and goose. Organ meats, such as liver. Dairy Cream, sour cream, cream cheese, and creamed cottage cheese. Whole-milk cheeses. Whole or 2% milk that is liquid, evaporated, or condensed. Whole buttermilk. Cream sauce or high-fat cheese sauce. Yogurt that is made from whole milk. Fats and oils Meat fat, or shortening. Cocoa butter, hydrogenated oils, palm oil, coconut oil, palm kernel oil. Solid fats and shortenings, including bacon fat, salt pork, lard, and butter. Nondairy cream substitutes. Salad dressings with cheese or sour cream. Beverages Regular sodas and juice drinks with added sugar. Sweets and desserts Frosting. Pudding. Cookies. Cakes. Pies. Milk chocolate or white chocolate. Buttered syrups. Full-fat ice cream or ice cream drinks. The items listed above may not be a complete list of foods and drinks to avoid. Contact a dietitian for more information. Summary  Heart-healthy meal planning includes eating less unhealthy fats, eating more healthy fats, and making other changes in your diet.  Eat a balanced diet. This includes fruits and  vegetables, low-fat or nonfat dairy, lean protein, nuts and legumes, whole grains, and heart-healthy oils and fats. This information is not intended to replace advice given to you by your health care provider. Make sure you discuss any questions you have with your health care provider. Document Revised: 01/28/2018 Document Reviewed: 01/01/2018 Elsevier Patient Education  2021 [employer]   Preventive Care 25-24 Years Old, Male Preventive care refers to lifestyle choices and visits with your health care provider that can promote health and wellness. This includes:  A yearly physical exam. This is also called an annual wellness visit.  Regular dental and eye exams.  Immunizations.  Screening for certain conditions.  Healthy lifestyle choices, such as: ? Eating a healthy diet. ? Getting regular exercise. ? Not using drugs or products that contain nicotine and tobacco. ? Limiting alcohol use. What can I expect for my preventive care visit? Physical exam Your health care provider will check your:  Height and weight. These may be used to calculate your BMI (body mass index). BMI is a measurement that tells if you are at a healthy weight.  Heart rate and blood pressure.  Body temperature.  Skin for abnormal spots. Counseling Your health care provider may ask you questions about your:  Past medical problems.  Family's medical history.  Alcohol, tobacco, and drug use.  Emotional well-being.  Home life and relationship well-being.  Sexual activity.  Diet, exercise, and sleep habits.  Work and work Statistician.  Access to firearms. What immunizations do I need? Vaccines are usually given at various ages, according to a schedule. Your health care provider will recommend vaccines for you based on your age, medical history, and lifestyle or other factors, such as travel or where you work.   What tests do I need? Blood tests  Lipid and cholesterol levels. These may be  checked every 5 years, or more often if you are over 53 years old.  Hepatitis C test.  Hepatitis B test. Screening  Lung cancer screening. You may have this screening every year starting at age 60 if you have a 30-pack-year history of smoking and currently smoke or have quit within the past 15 years.  Prostate cancer screening. Recommendations will vary depending on your family history and other risks.  Genital exam to check for testicular cancer or hernias.  Colorectal cancer screening. ? All adults should have this screening starting at age 3 and continuing until age 72. ? Your health care provider may recommend screening at age 28 if you are at increased risk. ? You will have tests every 1-10 years, depending on your results and the type of screening test.  Diabetes screening. ? This is done by checking your blood sugar (glucose) after you have not eaten for a while (fasting). ? You may have this done every 1-3 years.  STD (sexually transmitted disease) testing, if you are at risk. Follow these instructions at home: Eating and drinking  Eat a diet that includes fresh fruits and vegetables, whole grains, lean protein, and low-fat dairy products.  Take vitamin and mineral supplements as recommended by your health care provider.  Do not drink alcohol if your health care provider tells you not to drink.  If you drink alcohol: ? Limit how much you have to 0-2 drinks a day. ? Be aware of how much alcohol is in your drink. In the [place] one drink equals one 12 oz bottle of beer (355 mL), one 5 oz glass of wine (148 mL), or one 1 oz glass of hard liquor (44 mL).   Lifestyle  Take daily care of your teeth and gums. Brush your teeth every morning and night with fluoride toothpaste. Floss one time each day.  Stay active. Exercise for at least 30 minutes 5 or more days each week.  Do not use any products that contain nicotine or tobacco, such as cigarettes, e-cigarettes, and chewing  tobacco. If you need help quitting, ask your health care provider.  Do not use drugs.  If you are sexually active, practice safe sex. Use a condom or other form of protection to prevent STIs (sexually transmitted infections).  If told by your health care provider, take low-dose aspirin daily starting at age 36.  Find healthy ways to cope with stress, such as: ? Meditation, yoga, or listening to music. ? Journaling. ? Talking to a trusted person. ? Spending time with friends and family. Safety  Always wear your seat belt while driving or riding in a vehicle.  Do not drive: ? If you have been drinking alcohol. Do not ride with someone who has been drinking. ? When you are tired or distracted. ? While texting.  Wear a helmet and other protective equipment during sports activities.  If you have firearms in your house, make sure you follow all gun safety procedures. What's next?  Go to your health care provider once a year for an annual wellness visit.  Ask your health care provider how often you should have your eyes and teeth checked.  Stay up to date on all vaccines. This information is not intended to replace advice given to you by your health care provider. Make sure you discuss any questions you have with your health care provider. Document Revised: 08/23/2019 Document Reviewed: 11/18/2018 Elsevier Patient Education  2021 [employer].

## 2021-02-22 DIAGNOSIS — Z23 Encounter for immunization: Secondary | ICD-10-CM | POA: Diagnosis not present
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
## 2021-03-04 ENCOUNTER — Other Ambulatory Visit: Payer: Self-pay

## 2021-03-04 ENCOUNTER — Encounter: Payer: Self-pay | Admitting: Neurology

## 2021-03-04 ENCOUNTER — Ambulatory Visit: Payer: BC Managed Care – PPO | Admitting: Neurology

## 2021-03-04 VITALS — BP 128/84 | HR 88 | Ht 73.0 in | Wt 219.0 lb

## 2021-03-04 DIAGNOSIS — M542 Cervicalgia: Secondary | ICD-10-CM | POA: Diagnosis not present

## 2021-03-04 DIAGNOSIS — G243 Spasmodic torticollis: Secondary | ICD-10-CM

## 2021-03-04 NOTE — Patient Instructions (Signed)
Dystonia Dystonia is a condition that makes your muscles contract without warning (muscle spasms). It can make doing everyday tasks hard. There are different forms of dystonia. The condition can affect just one part of your body, or it can affect different parts of your body. Dystonia affects people in different ways. In some people, it is mild and goes away over time. In others, it is severe and may need treatment. Although there is no cure for dystonia, you can manage the condition with treatment. What are the causes? This condition may be present at birth. In this case, it is caused by:  Genetics. This means you inherited genes that lead to abnormal muscle contractions.  Abnormal basal ganglia function. This is a defect in the part of the brain that controls movement. The condition may also occur after birth (acquired). In this case, you develop the condition following:  Brain injury.  Infection.  Drug reaction. Sometimes the cause of dystonia is not known (idiopathic dystonia). What are the signs or symptoms? Symptoms of this condition depend on which type of dystonia you have. Common signs and symptoms include:  Muscle twitches or spasms around your eyes (blepharospasm).  Foot cramping or dragging.  Pulling of your neck to one side (torticollis).  Muscles spasms of the face.  Spasms of the voice box (larynx).  Tremors.  Muscle cramping after activity. How is this diagnosed? This condition may be diagnosed based on:  Symptoms and medical history.  Physical exam. You may also have other tests, including:  A blood test to check for genes that cause dystonia.  Brain imaging tests to rule out other causes of your symptoms. How is this treated? There are no treatments that can cure or prevent dystonia. Treatment to manage dystonia may include:  Taking medicines to relax muscles.  Using a heating pad, or receiving massage or physical therapy.  Taking a medicine that is  used to treat Parkinson's disease. This medicine improves symptoms of dystonia.  Injecting the affected muscles with a chemical (botulinum) that blocks muscle spasms. This treatment can block spasms for a few days to a few months.  Slowly weaning you from a specific medicine to see if your symptoms improve. This may be done if that medicine is thought to be causing dystonia.  Having surgery to implant an electrical device (deep brain simulator) to help override abnormal signals being sent to your muscles. This is done in severe cases.   Follow these instructions at home:  Do physical therapy exercises at home as instructed by your physical therapist.  Make sure that you have a good support system. Let your health care provider know if you are struggling with stress or anxiety.  Do not drink alcohol.  Do not use any products that contain nicotine or tobacco, such as cigarettes and e-cigarettes. If you need help quitting, ask your health care provider.  Do not drive or operate heavy machinery until your health care provider approves.  Take over-the-counter and prescription medicines only as told by your health care provider.  Keep all follow-up visits as told by your health care provider. This is important.   Contact a health care provider if:  Your condition is changing or getting worse.  You need more support taking care of yourself or handling household duties. Your health care provider may be able to arrange nursing assistance in your home. Summary  Dystonia is a condition that makes your muscles contract without warning (muscle spasms).  There are no treatments  that can cure or prevent dystonia. Medicines may be prescribed to manage symptoms.  Physical therapy may also be recommended to help maintain muscle strength and improve your balance. This information is not intended to replace advice given to you by your health care provider. Make sure you discuss any questions you have  with your health care provider. Document Revised: 10/13/2018 Document Reviewed: 12/29/2017 Elsevier Patient Education  2021 [employer].

## 2021-03-07 ENCOUNTER — Other Ambulatory Visit: Payer: Self-pay | Admitting: Physician Assistant

## 2021-03-27 ENCOUNTER — Other Ambulatory Visit: Payer: Self-pay | Admitting: Physician Assistant

## 2021-03-27 DIAGNOSIS — N529 Male erectile dysfunction, unspecified: Secondary | ICD-10-CM

## 2021-03-30 ENCOUNTER — Other Ambulatory Visit: Payer: Self-pay | Admitting: Physician Assistant

## 2021-04-01 ENCOUNTER — Ambulatory Visit
Admission: RE | Admit: 2021-04-01 | Discharge: 2021-04-01 | Disposition: A | Discharge status: Home / Self Care | Payer: BC Managed Care – PPO | Source: Ambulatory Visit | Attending: Neurology | Admitting: Neurology

## 2021-04-01 ENCOUNTER — Other Ambulatory Visit: Payer: Self-pay

## 2021-05-31 ENCOUNTER — Other Ambulatory Visit: Payer: Self-pay

## 2021-05-31 DIAGNOSIS — I10 Essential (primary) hypertension: Secondary | ICD-10-CM

## 2021-05-31 DIAGNOSIS — E782 Mixed hyperlipidemia: Secondary | ICD-10-CM

## 2021-06-03 ENCOUNTER — Other Ambulatory Visit: Payer: BC Managed Care – PPO

## 2021-06-03 ENCOUNTER — Other Ambulatory Visit: Payer: Self-pay

## 2021-06-03 DIAGNOSIS — E782 Mixed hyperlipidemia: Secondary | ICD-10-CM

## 2021-06-03 DIAGNOSIS — I10 Essential (primary) hypertension: Secondary | ICD-10-CM

## 2021-06-04 LAB — LIPID PANEL
Chol/HDL Ratio: 6.5 ratio — ABNORMAL HIGH (ref 0.0–5.0)
Cholesterol, Total: 196 mg/dL (ref 100–199)
HDL: 30 mg/dL — ABNORMAL LOW (ref >39)
LDL Chol Calc (NIH): 119 mg/dL — ABNORMAL HIGH (ref 0–99)
Triglycerides: 266 mg/dL — ABNORMAL HIGH (ref 0–149)
VLDL Cholesterol Cal: 47 mg/dL — ABNORMAL HIGH (ref 5–40)

## 2021-06-04 LAB — COMPREHENSIVE METABOLIC PANEL
ALT: 33 IU/L (ref 0–44)
AST: 25 IU/L (ref 0–40)
Albumin/Globulin Ratio: 1.9 (ref 1.2–2.2)
Albumin: 4.6 g/dL (ref 3.8–4.9)
Alkaline Phosphatase: 84 IU/L (ref 44–121)
BUN/Creatinine Ratio: 11 (ref 9–20)
BUN: 11 mg/dL (ref 6–24)
Bilirubin Total: 0.3 mg/dL (ref 0.0–1.2)
CO2: 24 mmol/L (ref 20–29)
Calcium: 9.5 mg/dL (ref 8.7–10.2)
Chloride: 104 mmol/L (ref 96–106)
Creatinine, Ser: 0.99 mg/dL (ref 0.76–1.27)
Globulin, Total: 2.4 g/dL (ref 1.5–4.5)
Glucose: 93 mg/dL (ref 65–99)
Potassium: 4.8 mmol/L (ref 3.5–5.2)
Sodium: 142 mmol/L (ref 134–144)
Total Protein: 7 g/dL (ref 6.0–8.5)
eGFR: 92 mL/min/{1.73_m2} (ref >59)

## 2021-06-05 ENCOUNTER — Ambulatory Visit (INDEPENDENT_AMBULATORY_CARE_PROVIDER_SITE_OTHER): Payer: BC Managed Care – PPO | Admitting: Physician Assistant

## 2021-06-05 ENCOUNTER — Other Ambulatory Visit: Payer: Self-pay

## 2021-06-05 ENCOUNTER — Encounter: Payer: Self-pay | Admitting: Physician Assistant

## 2021-06-05 VITALS — BP 142/87 | HR 81 | Temp 98.1°F | Ht 73.0 in | Wt 218.3 lb

## 2021-06-05 DIAGNOSIS — I10 Essential (primary) hypertension: Secondary | ICD-10-CM | POA: Diagnosis not present

## 2021-06-05 DIAGNOSIS — E782 Mixed hyperlipidemia: Secondary | ICD-10-CM

## 2021-06-05 NOTE — Patient Instructions (Signed)
Heart-Healthy Eating Plan Many factors influence your heart (coronary) health, including eating and exercise habits. Coronary risk increases with abnormal blood fat (lipid) levels. Heart-healthy meal planning includes limiting unhealthy fats,increasing healthy fats, and making other diet and lifestyle changes. What is my plan? Your health care provider may recommend that you: Limit your fat intake to _________% or less of your total calories each day. Limit your saturated fat intake to _________% or less of your total calories each day. Limit the amount of cholesterol in your diet to less than _________ mg per day. What are tips for following this plan? Cooking Cook foods using methods other than frying. Baking, boiling, grilling, and broiling are all good options. Other ways to reduce fat include: Removing the skin from poultry. Removing all visible fats from meats. Steaming vegetables in water or broth. Meal planning  At meals, imagine dividing your plate into fourths: Fill one-half of your plate with vegetables and green salads. Fill one-fourth of your plate with whole grains. Fill one-fourth of your plate with lean protein foods. Eat 4-5 servings of vegetables per day. One serving equals 1 cup raw or cooked vegetable, or 2 cups raw leafy greens. Eat 4-5 servings of fruit per day. One serving equals 1 medium whole fruit,  cup dried fruit,  cup fresh, frozen, or canned fruit, or  cup 100% fruit juice. Eat more foods that contain soluble fiber. Examples include apples, broccoli, carrots, beans, peas, and barley. Aim to get 25-30 g of fiber per day. Increase your consumption of legumes, nuts, and seeds to 4-5 servings per week. One serving of dried beans or legumes equals  cup cooked, 1 serving of nuts is  cup, and 1 serving of seeds equals 1 tablespoon.  Fats Choose healthy fats more often. Choose monounsaturated and polyunsaturated fats, such as olive and canola oils, flaxseeds,  walnuts, almonds, and seeds. Eat more omega-3 fats. Choose salmon, mackerel, sardines, tuna, flaxseed oil, and ground flaxseeds. Aim to eat fish at least 2 times each week. Check food labels carefully to identify foods with trans fats or high amounts of saturated fat. Limit saturated fats. These are found in animal products, such as meats, butter, and cream. Plant sources of saturated fats include palm oil, palm kernel oil, and coconut oil. Avoid foods with partially hydrogenated oils in them. These contain trans fats. Examples are stick margarine, some tub margarines, cookies, crackers, and other baked goods. Avoid fried foods. General information Eat more home-cooked food and less restaurant, buffet, and fast food. Limit or avoid alcohol. Limit foods that are high in starch and sugar. Lose weight if you are overweight. Losing just 5-10% of your body weight can help your overall health and prevent diseases such as diabetes and heart disease. Monitor your salt (sodium) intake, especially if you have high blood pressure. Talk with your health care provider about your sodium intake. Try to incorporate more vegetarian meals weekly. What foods can I eat? Fruits All fresh, canned (in natural juice), or frozen fruits. Vegetables Fresh or frozen vegetables (raw, steamed, roasted, or grilled). Green salads. Grains Most grains. Choose whole wheat and whole grains most of the time. Rice andpasta, including brown rice and pastas made with whole wheat. Meats and other proteins Lean, well-trimmed beef, veal, pork, and lamb. Chicken and turkey without skin. All fish and shellfish. Wild duck, rabbit, pheasant, and venison. Egg whites or low-cholesterol egg substitutes. Dried beans, peas, lentils, and tofu. Seedsand most nuts. Dairy Low-fat or nonfat cheeses, including ricotta   and mozzarella. Skim or 1% milk (liquid, powdered, or evaporated). Buttermilk made with low-fat milk. Nonfat orlow-fat yogurt. Fats  and oils Non-hydrogenated (trans-free) margarines. Vegetable oils, including soybean, sesame, sunflower, olive, peanut, safflower, corn, canola, and cottonseed. Salad dressings or mayonnaisemade with a vegetable oil. Beverages Water (mineral or sparkling). Coffee and tea. Diet carbonated beverages. Sweets and desserts Sherbet, gelatin, and fruit ice. Small amounts of dark chocolate. Limit all sweets and desserts. Seasonings and condiments All seasonings and condiments. The items listed above may not be a complete list of foods and beverages you can eat. Contact a dietitian for more options. What foods are not recommended? Fruits Canned fruit in heavy syrup. Fruit in cream or butter sauce. Fried fruit. Limitcoconut. Vegetables Vegetables cooked in cheese, cream, or butter sauce. Fried vegetables. Grains Breads made with saturated or trans fats, oils, or whole milk. Croissants. Sweet rolls. Donuts. High-fat crackers,such as cheese crackers. Meats and other proteins Fatty meats, such as hot dogs, ribs, sausage, bacon, rib-eye roast or steak. High-fat deli meats, such as salami and bologna. Caviar. Domestic duck andgoose. Organ meats, such as liver. Dairy Cream, sour cream, cream cheese, and creamed cottage cheese. Whole milk cheeses. Whole or 2% milk (liquid, evaporated, or condensed). Whole buttermilk.Cream sauce or high-fat cheese sauce. Whole-milk yogurt. Fats and oils Meat fat, or shortening. Cocoa butter, hydrogenated oils, palm oil, coconut oil, palm kernel oil. Solid fats and shortenings, including bacon fat, salt pork, lard, and butter. Nondairy cream substitutes. Salad dressings with cheeseor sour cream. Beverages Regular sodas and any drinks with added sugar. Sweets and desserts Frosting. Pudding. Cookies. Cakes. Pies. Milk chocolate or white chocolate.Buttered syrups. Full-fat ice cream or ice cream drinks. The items listed above may not be a complete list of foods and beverages to  avoid. Contact a dietitian for more information. Summary Heart-healthy meal planning includes limiting unhealthy fats, increasing healthy fats, and making other diet and lifestyle changes. Lose weight if you are overweight. Losing just 5-10% of your body weight can help your overall health and prevent diseases such as diabetes and heart disease. Focus on eating a balance of foods, including fruits and vegetables, low-fat or nonfat dairy, lean protein, nuts and legumes, whole grains, and heart-healthy oils and fats. This information is not intended to replace advice given to you by your health care provider. Make sure you discuss any questions you have with your healthcare provider. Document Revised: 01/01/2018 Document Reviewed: 01/01/2018 Elsevier Patient Education  2022 [employer] Inc.  

## 2021-06-05 NOTE — Progress Notes (Signed)
Established Patient Office Visit  Subjective:  Patient ID: Patrick Avila, male    DOB: 08/16/1966  Age: 55 y.o. MRN: [id number]  CC:  Chief Complaint  Patient presents with   Follow-up   Hypertension   Hyperlipidemia    HPI Patrick Avila presents for follow up on hypertension and hyperlipidemia.  HTN: Pt denies chest pain, palpitations, dizziness or lower extremity swelling. Patient is managing with diet. Checks BP at home and readings have been in the 120s/70s. Pt follows a low salt diet.  HLD: Pt is trying to manage with diet. Reports since stopping statin has noticed an improvement with joint pain. Has been diligent with maintaining physical activity. Reports does cardio 30 minutes twice per week and weight lifting twice per week. States his diet is usually consistent with lean meats such as chicken, rarely has red meat, cooks with olive oil, whole grain cereal with lactaid milk 2 or 1%.   Past Medical History:  Diagnosis Date   Anxiety    [employer]reesia 02/19/2021   Hyperlipidemia    [employer]reesia 02/19/2021   Hypertension    [employer]reesia 02/19/2021    Past Surgical History:  Procedure Laterality Date   TONSILLECTOMY  1975   VASECTOMY  1999    Family History  Problem Relation Age of Onset   Kidney cancer Mother    Diabetes Mother    Hypertension Mother    Alcoholism Father    Prostate cancer Father    Diabetes Father    Hypertension Father    COPD Father    Alcoholism Maternal Grandmother    Diabetes Maternal Grandmother    Alcoholism Maternal Grandfather    Diabetes Maternal Grandfather    SIDS Brother     Social History   Socioeconomic History   Marital status: Married    Spouse name: Not on file   Number of children: Not on file   Years of education: Not on file   Highest education level: Not on file  Occupational History   Not on file  Tobacco Use   Smoking status: Former    Packs/day: 1.50    Years: 15.00    Pack years: 22.50    Types:  Cigarettes    Quit date: 2006    Years since quitting: 16.5   Smokeless tobacco: Current    Types: Chew   Tobacco comments:    still smokes cigars occ.   Vaping Use   Vaping Use: Never used  Substance and Sexual Activity   Alcohol use: Not Currently    Alcohol/week: 30.0 standard drinks    Types: 30 Standard drinks or equivalent per week    Comment: hx of EtOH abuse - quit 2019   Drug use: Not on file   Sexual activity: Yes    Partners: Female    Birth control/protection: None  Other Topics Concern   Not on file  Social History Narrative   Not on file   Social Determinants of Health   Financial Resource Strain: Not on file  Food Insecurity: Not on file  Transportation Needs: Not on file  Physical Activity: Not on file  Stress: Not on file  Social Connections: Not on file  Intimate Partner Violence: Not on file    Outpatient Medications Prior to Visit  Medication Sig Dispense Refill   cholecalciferol (VITAMIN D3) 25 MCG (1000 UNIT) tablet Take 1,000 Units by mouth daily.     Cinnamon 500 MG capsule Take 1,000 mg by mouth daily.  fluticasone (FLONASE) 50 MCG/ACT nasal spray PLACE 1 SPRAY INTO EACH NOSTRIL FOLLOWING SINUS RINSES TWICE A DAY. 16 g 1   glucosamine-chondroitin 500-400 MG tablet Take 1 tablet by mouth 3 (three) times daily.     Krill Oil 350 MG CAPS Take 1 capsule by mouth daily.     loratadine (CLARITIN) 10 MG tablet Take 10 mg by mouth daily.     Multiple Vitamin (ONE-A-DAY MENS PO) Take 1 tablet by mouth daily.     busPIRone (BUSPAR) 10 MG tablet TAKE 1 TABLET BY MOUTH 3 TIMES DAILY. 90 tablet 0   sildenafil (REVATIO) 20 MG tablet TAKE 1 TABLET (20 MG TOTAL) BY MOUTH AS NEEDED. 20 tablet 0   No facility-administered medications prior to visit.    Allergies  Allergen Reactions   Nylon Rash    sutures   Other Itching, Rash and Swelling    ROS Review of Systems Review of Systems:  A fourteen system review of systems was performed and found to be  positive as per HPI.   Objective:    Physical Exam General:  Well Developed, well nourished, in no acute distress  Neuro:  Alert and oriented,  extra-ocular muscles intact  HEENT:  Normocephalic, atraumatic, neck supple Skin:  no gross rash, warm, pink. Cardiac:  RRR Respiratory:  ECTA B/L, Not using accessory muscles, speaking in full sentences- unlabored. Vascular:  Ext warm, no cyanosis apprec.; cap RF less 2 sec. Psych:  No HI/SI, judgement and insight good, Euthymic mood. Full Affect.   BP (!) 142/87   Pulse 81   Temp 98.1 F (36.7 C)   Ht 6' 1"  (1.854 m)   Wt 218 lb 4.8 oz (99 kg)   SpO2 99%   BMI 28.80 kg/m  Wt Readings from Last 3 Encounters:  06/05/21 218 lb 4.8 oz (99 kg)  03/04/21 219 lb (99.3 kg)  02/20/21 213 lb 11.2 oz (96.9 kg)     Health Maintenance Due  Topic Date Due   COLONOSCOPY (Pts 45-62yr Insurance coverage will need to be confirmed)  Never done   COVID-19 Vaccine (4 - Booster for [place]) 02/25/2021    There are no preventive care reminders to display for this patient.  Lab Results  Component Value Date   TSH 0.992 02/13/2021   Lab Results  Component Value Date   WBC 9.3 02/13/2021   HGB 15.6 02/13/2021   HCT 46.9 02/13/2021   MCV 84 02/13/2021   PLT 246 02/13/2021   Lab Results  Component Value Date   NA 142 06/03/2021   K 4.8 06/03/2021   CO2 24 06/03/2021   GLUCOSE 93 06/03/2021   BUN 11 06/03/2021   CREATININE 0.99 06/03/2021   BILITOT 0.3 06/03/2021   ALKPHOS 84 06/03/2021   AST 25 06/03/2021   ALT 33 06/03/2021   PROT 7.0 06/03/2021   ALBUMIN 4.6 06/03/2021   CALCIUM 9.5 06/03/2021   EGFR 92 06/03/2021   Lab Results  Component Value Date   CHOL 196 06/03/2021   Lab Results  Component Value Date   HDL 30 (L) 06/03/2021   Lab Results  Component Value Date   LDLCALC 119 (H) 06/03/2021   Lab Results  Component Value Date   TRIG 266 (H) 06/03/2021   Lab Results  Component Value Date   CHOLHDL 6.5 (H)  06/03/2021   Lab Results  Component Value Date   HGBA1C 5.5 02/13/2021      Assessment & Plan:   Problem List  Items Addressed This Visit       Cardiovascular and Mediastinum   Essential hypertension - Primary     Other   Mixed hyperlipidemia   Mixed hyperlipidemia: -Discussed recent lipid panel: total cholesterol 196, triglycerides 266, HDL 30, LDL 119. The 10-year ASCVD risk score Mikey Bussing DC Brooke Bonito., et al., 2013) is: 8.1% -Recommend to follow a diet low in fat and incorporate more whole grains and vegetables. Provided handout. Increase aerobic exercise. Will repeat lipid panel at follow up visit and if lipid panel fails to improve highly encourage to consider restarting statin therapy.  Hypertension: -BP elevated in office, ambulatory BP readings at goal. I -Continue ambulatory BP monitoring. Recommend to notify the clinic if BP consistently >130/80 for treatment adjustments and restarting antihypertensive. -Continue low sodium diet. -Recent CMP normal. -Will continue to monitor.    No orders of the defined types were placed in this encounter.   Follow-up: Return in about 4 months (around 10/05/2021) for HLD, HTN, mood and FBW (cmp, lipid panel) few days prior .    Lorrene Reid, PA-C

## 2021-06-06 ENCOUNTER — Other Ambulatory Visit: Payer: Self-pay | Admitting: Physician Assistant

## 2021-06-06 DIAGNOSIS — N529 Male erectile dysfunction, unspecified: Secondary | ICD-10-CM

## 2021-06-07 MED ORDER — BUSPIRONE HCL 10 MG PO TABS: 10.0000 mg | ORAL_TABLET | Freq: Three times a day (TID) | ORAL | 0 refills | Status: DC

## 2021-06-07 MED ORDER — SILDENAFIL CITRATE 20 MG PO TABS: 20.0000 mg | ORAL_TABLET | ORAL | 0 refills | Status: DC | PRN

## 2021-06-21 ENCOUNTER — Encounter: Payer: Self-pay | Admitting: Gastroenterology

## 2021-07-01 ENCOUNTER — Other Ambulatory Visit: Payer: Self-pay

## 2021-07-01 ENCOUNTER — Ambulatory Visit (AMBULATORY_SURGERY_CENTER): Payer: BC Managed Care – PPO | Admitting: *Deleted

## 2021-07-01 VITALS — Ht 73.0 in | Wt 218.0 lb

## 2021-07-01 DIAGNOSIS — Z1211 Encounter for screening for malignant neoplasm of colon: Secondary | ICD-10-CM

## 2021-07-01 MED ORDER — PEG-KCL-NACL-NASULF-NA ASC-C 100 G PO SOLR: 1.0000 | Freq: Once | ORAL | 0 refills | Status: AC

## 2021-07-01 NOTE — Progress Notes (Signed)
Patient's pre-visit was done today over the phone with the patient due to COVID-19 pandemic. Name,DOB and address verified. Insurance verified. Patient denies any allergies to Eggs and Soy. Patient denies any problems with anesthesia/sedation. Patient denies taking blood thinners. Pt aware to stop diet pills 10 days before procedure. No home Oxygen. Packet of Prep instructions mailed to patient including a copy of a consent form-pt is aware. Patient understands to call us back with any questions or concerns. Patient is aware of our care-partner policy and 0000000 safety protocol.   EMMI education assigned to the patient for the procedure, sent to [facility].   The patient is COVID-19 vaccinated, per patient.

## 2021-07-06 ENCOUNTER — Other Ambulatory Visit: Payer: Self-pay | Admitting: Physician Assistant

## 2021-07-06 DIAGNOSIS — N529 Male erectile dysfunction, unspecified: Secondary | ICD-10-CM

## 2021-07-18 ENCOUNTER — Ambulatory Visit: Payer: BC Managed Care – PPO | Admitting: Gastroenterology

## 2021-07-18 ENCOUNTER — Other Ambulatory Visit: Payer: Self-pay

## 2021-07-18 ENCOUNTER — Encounter: Payer: Self-pay | Admitting: Gastroenterology

## 2021-07-18 VITALS — BP 100/65 | HR 65 | Temp 98.0°F | Resp 13 | Ht 73.0 in | Wt 218.0 lb

## 2021-07-18 DIAGNOSIS — D122 Benign neoplasm of ascending colon: Secondary | ICD-10-CM | POA: Diagnosis not present

## 2021-07-18 DIAGNOSIS — D123 Benign neoplasm of transverse colon: Secondary | ICD-10-CM

## 2021-07-18 DIAGNOSIS — D120 Benign neoplasm of cecum: Secondary | ICD-10-CM | POA: Diagnosis not present

## 2021-07-18 DIAGNOSIS — Z1211 Encounter for screening for malignant neoplasm of colon: Secondary | ICD-10-CM

## 2021-07-18 DIAGNOSIS — D125 Benign neoplasm of sigmoid colon: Secondary | ICD-10-CM

## 2021-07-18 MED ORDER — SODIUM CHLORIDE 0.9 % IV SOLN: 500.0000 mL | Freq: Once | INTRAVENOUS | Status: DC

## 2021-07-18 NOTE — Op Note (Signed)
[facility] Patient Name: Patrick Avila Procedure Date: 07/18/2021 12:25 PM MRN: [id number] Endoscopist: Nicki Reaper E. Candis Schatz , MD Age: 55 Referring MD:  Date of Birth: March 26, 1966 Gender: Male Account #: [id number] Procedure:                Colonoscopy Indications:              Screening for colorectal malignant neoplasm, This                            is the patient's first colonoscopy Medicines:                Monitored Anesthesia Care Procedure:                Pre-Anesthesia Assessment:                           - Prior to the procedure, a History and Physical                            was performed, and patient medications and                            allergies were reviewed. The patient's tolerance of                            previous anesthesia was also reviewed. The risks                            and benefits of the procedure and the sedation                            options and risks were discussed with the patient.                            All questions were answered, and informed consent                            was obtained. Prior Anticoagulants: The patient has                            taken no previous anticoagulant or antiplatelet                            agents. ASA Grade Assessment: II - A patient with                            mild systemic disease. After reviewing the risks                            and benefits, the patient was deemed in                            satisfactory condition to undergo the procedure.  After obtaining informed consent, the colonoscope                            was passed under direct vision. Throughout the                            procedure, the patient's blood pressure, pulse, and                            oxygen saturations were monitored continuously. The                            Olympus CF-HQ190L ([id number]) Colonoscope was                            introduced through the  anus and advanced to the the                            terminal ileum, with identification of the                            appendiceal orifice and IC valve. The colonoscopy                            was performed without difficulty. The patient                            tolerated the procedure well. The quality of the                            bowel preparation was adequate. The terminal ileum,                            ileocecal valve, appendiceal orifice, and rectum                            were photographed. Scope In: 12:34:46 PM Scope Out: 12:55:37 PM Scope Withdrawal Time: 0 hours 17 minutes 12 seconds  Total Procedure Duration: 0 hours 20 minutes 51 seconds  Findings:                 The perianal and digital rectal examinations were                            normal. Pertinent negatives include normal                            sphincter tone and no palpable rectal lesions.                           A 2 mm polyp was found in the cecum. The polyp was                            sessile. The polyp was removed with a cold snare.  Resection and retrieval were complete. Estimated                            blood loss was minimal.                           A 3 mm polyp was found in the ascending colon. The                            polyp was sessile. The polyp was removed with a                            cold snare. Resection and retrieval were complete.                            Estimated blood loss was minimal.                           A 4 mm polyp was found in the transverse colon. The                            polyp was sessile. The polyp was removed with a                            cold snare. Resection and retrieval were complete.                            Estimated blood loss was minimal.                           A 3 mm polyp was found in the transverse colon. The                            polyp was sessile. The polyp was removed with a                             cold snare. Resection and retrieval were complete.                           A 6 mm polyp was found in the sigmoid colon. The                            polyp was sessile. The polyp was removed with a                            cold snare. Resection and retrieval were complete.                            Estimated blood loss was minimal.                           Many small and large-mouthed diverticula were found  in the sigmoid colon and descending colon.                           The exam was otherwise normal throughout the                            examined colon.                           The terminal ileum appeared normal.                           The retroflexed view of the distal rectum and anal                            verge was normal and showed no anal or rectal                            abnormalities. Complications:            No immediate complications. Estimated Blood Loss:     Estimated blood loss: none. Impression:               - One 2 mm polyp in the cecum, removed with a cold                            snare. Resected and retrieved.                           - One 3 mm polyp in the ascending colon, removed                            with a cold snare. Resected and retrieved.                           - One 4 mm polyp in the transverse colon, removed                            with a cold snare. Resected and retrieved.                           - One 6 mm polyp in the sigmoid colon, removed with                            a cold snare. Resected and retrieved.                           - Diverticulosis in the sigmoid colon and in the                            descending colon.                           - The examined portion of the ileum was normal.                           -  The distal rectum and anal verge are normal on                            retroflexion view. Recommendation:           - Patient has a  contact number available for                            emergencies. The signs and symptoms of potential                            delayed complications were discussed with the                            patient. Return to normal activities tomorrow.                            Written discharge instructions were provided to the                            patient.                           - Resume previous diet.                           - Continue present medications.                           - Await pathology results.                           - Repeat colonoscopy (date not yet determined) for                            surveillance based on pathology results. Genevra Orne E. Candis Schatz, MD 07/18/2021 1:01:46 PM This report has been signed electronically.

## 2021-07-18 NOTE — Progress Notes (Signed)
Pt's states no medical or surgical changes since previsit or office visit. VS assessed by D.B

## 2021-07-18 NOTE — Progress Notes (Signed)
Called to room to assist during endoscopic procedure.  Patient ID and intended procedure confirmed with present staff. Received instructions for my participation in the procedure from the performing physician.  

## 2021-07-18 NOTE — Progress Notes (Signed)
HPI : 55 y/o m here for initial avg risk screening colonoscopy.  NO fam hx of colon cancer.  No GI symptoms.   Past Medical History:  Diagnosis Date   Anxiety    Phreesia 02/19/2021   Hyperlipidemia    Phreesia 02/19/2021   Hypertension    Phreesia 02/19/2021     Past Surgical History:  Procedure Laterality Date   TONSILLECTOMY  1975   VASECTOMY  1999   Family History  Problem Relation Age of Onset   Kidney cancer Mother    Diabetes Mother    Hypertension Mother    Alcoholism Father    Prostate cancer Father    Diabetes Father    Hypertension Father    COPD Father    SIDS Brother    Alcoholism Maternal Grandmother    Diabetes Maternal Grandmother    Alcoholism Maternal Grandfather    Diabetes Maternal Grandfather    Esophageal cancer Other    Colon cancer Neg Hx    Colon polyps Neg Hx    Rectal cancer Neg Hx    Stomach cancer Neg Hx    Social History   Tobacco Use   Smoking status: Former    Packs/day: 1.50    Years: 15.00    Pack years: 22.50    Types: Cigarettes, Cigars    Quit date: 2006    Years since quitting: 16.6   Smokeless tobacco: Current    Types: Snuff   Tobacco comments:    still smokes cigars occ.   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 90 tablet 0   cholecalciferol (VITAMIN D3) 25 MCG (1000 UNIT) tablet Take 1,000 Units by mouth daily.     Cinnamon 500 MG capsule Take 1,000 mg by mouth daily.     fluticasone (FLONASE) 50 MCG/ACT nasal spray PLACE 1 SPRAY INTO EACH NOSTRIL FOLLOWING SINUS RINSES TWICE A DAY. 16 g 1   glucosamine-chondroitin 500-400 MG tablet Take 1 tablet by mouth 3 (three) times daily.     Krill Oil 350 MG CAPS Take 1 capsule by mouth daily.      loratadine (CLARITIN) 10 MG tablet Take 10 mg by mouth daily.     Multiple Vitamin (ONE-A-DAY MENS PO) Take 1 tablet by mouth daily.     POTASSIUM PO Take 1 tablet by mouth daily.     sildenafil (REVATIO) 20 MG tablet TAKE 1 TABLET (20 MG TOTAL) BY MOUTH AS NEEDED. (Patient not taking: Reported on 07/18/2021) 20 tablet 0   UNABLE TO FIND Med Name: Hydroxycut daily weight loss medicine     Current Facility-Administered Medications  Medication Dose Route Frequency Provider Last Rate Last Admin   0.9 %  sodium chloride infusion  500 mL Intravenous Once Daryel November, MD       Allergies  Allergen Reactions   Nylon Rash    sutures   Other Itching, Rash and Swelling     Review of Systems: All systems reviewed and negative except where noted in HPI.    No results found.  Physical Exam: BP 109/66   Pulse 86   Temp 98 F (36.7 C) (Skin)   Ht  $'6\' 1"'h$  (1.854 m)   Wt 218 lb (98.9 kg)   SpO2 95%   BMI 28.76 kg/m  Constitutional: Pleasant,well-developed, Caucasian male in no acute distress. HEENT: Normocephalic and atraumatic. Conjunctivae are normal. No scleral icterus. MP2 Cardiovascular: Normal rate, regular rhythm.  Pulmonary/chest: Effort normal and breath sounds normal. No wheezing, rales or rhonchi. Abdominal: Soft, nondistended, nontender. Bowel sounds active throughout. There are no masses palpable. No hepatomegaly. Neurological: Alert and oriented to person place and time. Psychiatric: Normal mood and affect. Behavior is normal.  CBC    Component Value Date/Time   WBC 9.3 02/13/2021 0837   RBC 5.60 02/13/2021 0837   HGB 15.6 02/13/2021 0837   HCT 46.9 02/13/2021 0837   PLT 246 02/13/2021 0837   MCV 84 02/13/2021 0837   MCH 27.9 02/13/2021 0837   MCHC 33.3 02/13/2021 0837   RDW 12.4 02/13/2021 0837   LYMPHSABS 2.4 07/22/2018 0810   EOSABS 0.2 07/22/2018 0810   BASOSABS 0.0 07/22/2018 0810    CMP     Component Value Date/Time   NA 142 06/03/2021 0822    K 4.8 06/03/2021 0822   CL 104 06/03/2021 0822   CO2 24 06/03/2021 0822   GLUCOSE 93 06/03/2021 0822   BUN 11 06/03/2021 0822   CREATININE 0.99 06/03/2021 0822   CALCIUM 9.5 06/03/2021 0822   PROT 7.0 06/03/2021 0822   ALBUMIN 4.6 06/03/2021 0822   AST 25 06/03/2021 0822   ALT 33 06/03/2021 0822   ALKPHOS 84 06/03/2021 0822   BILITOT 0.3 06/03/2021 0822   GFRNONAA 95 02/20/2020 0950   GFRAA 109 02/20/2020 0950     ASSESSMENT AND PLAN: 55 y/o m here for initial avg risk screening colonscopy.  Lorrene Reid, PA-C

## 2021-07-18 NOTE — Progress Notes (Signed)
A/ox3, pleased with MAC, report to RN 

## 2021-07-18 NOTE — Patient Instructions (Signed)
Thank you for letting us take care of your healthcare needs today. Please see handouts give to you on Polyps and Diverticulosis.    YOU HAD AN ENDOSCOPIC PROCEDURE TODAY AT [facility]:   Refer to the procedure report that was given to you for any specific questions about what was found during the examination.  If the procedure report does not answer your questions, please call your gastroenterologist to clarify.  If you requested that your care partner not be given the details of your procedure findings, then the procedure report has been included in a sealed envelope for you to review at your convenience later.  YOU SHOULD EXPECT: Some feelings of bloating in the abdomen. Passage of more gas than usual.  Walking can help get rid of the air that was put into your GI tract during the procedure and reduce the bloating. If you had a lower endoscopy (such as a colonoscopy or flexible sigmoidoscopy) you may notice spotting of blood in your stool or on the toilet paper. If you underwent a bowel prep for your procedure, you may not have a normal bowel movement for a few days.  Please Note:  You might notice some irritation and congestion in your nose or some drainage.  This is from the oxygen used during your procedure.  There is no need for concern and it should clear up in a day or so.  SYMPTOMS TO REPORT IMMEDIATELY:  Following lower endoscopy (colonoscopy or flexible sigmoidoscopy):  Excessive amounts of blood in the stool  Significant tenderness or worsening of abdominal pains  Swelling of the abdomen that is new, acute  Fever of 100F or higher   For urgent or emergent issues, a gastroenterologist can be reached at any hour by calling [phone]. Do not use MyChart messaging for urgent concerns.    DIET:  We do recommend a small meal at first, but then you may proceed to your regular diet.  Drink plenty of fluids but you should avoid alcoholic beverages for 24  hours.  ACTIVITY:  You should plan to take it easy for the rest of today and you should NOT DRIVE or use heavy machinery until tomorrow (because of the sedation medicines used during the test).    FOLLOW UP: Our staff will call the number listed on your records 48-72 hours following your procedure to check on you and address any questions or concerns that you may have regarding the information given to you following your procedure. If we do not reach you, we will leave a message.  We will attempt to reach you two times.  During this call, we will ask if you have developed any symptoms of COVID 19. If you develop any symptoms (ie: fever, flu-like symptoms, shortness of breath, cough etc.) before then, please call [phone].  If you test positive for Covid 19 in the 2 weeks post procedure, please call and report this information to [place].    If any biopsies were taken you will be contacted by phone or by letter within the next 1-3 weeks.  Please call us at [phone] if you have not heard about the biopsies in 3 weeks.    SIGNATURES/CONFIDENTIALITY: You and/or your care partner have signed paperwork which will be entered into your electronic medical record.  These signatures attest to the fact that that the information above on your After Visit Summary has been reviewed and is understood.  Full responsibility of the confidentiality of this discharge information  lies with you and/or your care-partner.  

## 2021-07-18 NOTE — Progress Notes (Signed)
[place]  relieves [employer]

## 2021-07-22 ENCOUNTER — Telehealth: Payer: Self-pay | Admitting: *Deleted

## 2021-07-22 NOTE — Telephone Encounter (Signed)
  Follow up Call-  Call back number 07/18/2021  Post procedure Call Back phone  # [phone]  Permission to leave phone message Yes  Some recent data might be hidden     Patient questions:  Do you have a fever, pain , or abdominal swelling? No. Pain Score  0 *  Have you tolerated food without any problems? Yes.    Have you been able to return to your normal activities? Yes.    Do you have any questions about your discharge instructions: Diet   No. Medications  No. Follow up visit  No.  Do you have questions or concerns about your Care? No.  Actions: * If pain score is 4 or above: No action needed, pain <4.  Have you developed a fever since your procedure? no  2.   Have you had an respiratory symptoms (SOB or cough) since your procedure? no  3.   Have you tested positive for COVID 19 since your procedure no  4.   Have you had any family members/close contacts diagnosed with the COVID 19 since your procedure?  no   If yes to any of these questions please route to Joylene John, RN and Joella Prince, RN

## 2021-08-07 NOTE — Progress Notes (Signed)
Mr. Zerkel, Four polyps that I removed during your recent procedure were completely benign but were proven to be "pre-cancerous" polyps that MAY have grown into cancers if they had not been removed.  Studies shows that at least 20% of women over age 53 and 30% of men over age 7 have pre-cancerous polyps.  Based on current nationally recognized surveillance guidelines, I recommend that you have a repeat colonoscopy in 5 years.   If you develop any new rectal bleeding, abdominal pain or significant bowel habit changes, please contact me before then.

## 2021-09-04 ENCOUNTER — Other Ambulatory Visit: Payer: Self-pay | Admitting: Physician Assistant

## 2021-10-03 ENCOUNTER — Other Ambulatory Visit: Payer: Self-pay

## 2021-10-03 DIAGNOSIS — I10 Essential (primary) hypertension: Secondary | ICD-10-CM

## 2021-10-03 DIAGNOSIS — E782 Mixed hyperlipidemia: Secondary | ICD-10-CM

## 2021-10-04 ENCOUNTER — Other Ambulatory Visit: Payer: BC Managed Care – PPO

## 2021-10-04 ENCOUNTER — Other Ambulatory Visit: Payer: Self-pay

## 2021-10-04 DIAGNOSIS — E782 Mixed hyperlipidemia: Secondary | ICD-10-CM

## 2021-10-04 DIAGNOSIS — I10 Essential (primary) hypertension: Secondary | ICD-10-CM | POA: Diagnosis not present

## 2021-10-05 LAB — COMPREHENSIVE METABOLIC PANEL
ALT: 22 IU/L (ref 0–44)
AST: 18 IU/L (ref 0–40)
Albumin/Globulin Ratio: 2.1 (ref 1.2–2.2)
Albumin: 4.6 g/dL (ref 3.8–4.9)
Alkaline Phosphatase: 79 IU/L (ref 44–121)
BUN/Creatinine Ratio: 12 (ref 9–20)
BUN: 14 mg/dL (ref 6–24)
Bilirubin Total: 0.6 mg/dL (ref 0.0–1.2)
CO2: 24 mmol/L (ref 20–29)
Calcium: 9.6 mg/dL (ref 8.7–10.2)
Chloride: 102 mmol/L (ref 96–106)
Creatinine, Ser: 1.17 mg/dL (ref 0.76–1.27)
Globulin, Total: 2.2 g/dL (ref 1.5–4.5)
Glucose: 95 mg/dL (ref 70–99)
Potassium: 4.5 mmol/L (ref 3.5–5.2)
Sodium: 140 mmol/L (ref 134–144)
Total Protein: 6.8 g/dL (ref 6.0–8.5)
eGFR: 75 mL/min/{1.73_m2} (ref >59)

## 2021-10-05 LAB — LIPID PANEL
Chol/HDL Ratio: 5.1 ratio — ABNORMAL HIGH (ref 0.0–5.0)
Cholesterol, Total: 172 mg/dL (ref 100–199)
HDL: 34 mg/dL — ABNORMAL LOW (ref >39)
LDL Chol Calc (NIH): 116 mg/dL — ABNORMAL HIGH (ref 0–99)
Triglycerides: 120 mg/dL (ref 0–149)
VLDL Cholesterol Cal: 22 mg/dL (ref 5–40)

## 2021-10-09 ENCOUNTER — Ambulatory Visit: Payer: BC Managed Care – PPO | Admitting: Physician Assistant

## 2021-10-14 ENCOUNTER — Encounter: Payer: Self-pay | Admitting: Physician Assistant

## 2021-10-14 ENCOUNTER — Ambulatory Visit: Payer: BC Managed Care – PPO | Admitting: Physician Assistant

## 2021-10-14 ENCOUNTER — Other Ambulatory Visit: Payer: Self-pay

## 2021-10-14 VITALS — BP 126/79 | HR 76 | Temp 98.1°F | Ht 73.0 in | Wt 206.0 lb

## 2021-10-14 DIAGNOSIS — F4323 Adjustment disorder with mixed anxiety and depressed mood: Secondary | ICD-10-CM | POA: Diagnosis not present

## 2021-10-14 DIAGNOSIS — I10 Essential (primary) hypertension: Secondary | ICD-10-CM

## 2021-10-14 DIAGNOSIS — E782 Mixed hyperlipidemia: Secondary | ICD-10-CM

## 2021-10-14 NOTE — Patient Instructions (Signed)

## 2021-10-14 NOTE — Progress Notes (Signed)
Established Patient Office Visit  Subjective:  Patient ID: Patrick Avila, male    DOB: Jun 16, 1966  Age: 55 y.o. MRN: [id number]  CC:  Chief Complaint  Patient presents with   Follow-up   Hypertension   Hyperlipidemia    HPI Patrick Avila presents for follow up on hypertension and hyperlipidemia.  HTN: Pt denies chest pain, palpitations, dizziness, orthopnea, or lower extremity swelling. Managing with diet. Checks BP at home and readings range 105-130/60-70s, pulse low 70s. Pt tries to monitor sodium intake.   HLD: Pt trying to manage with diet. Patient limits red meat and fried foods. Has reduced carbohydrates. Reports has noticed some weight loss.   Mood: Reports Buspar tends to do well with "highs and lows." Medication seems to keep his mood stable most of time. Sometimes does have increased anxiety depending how stressed or overwhelmed he feels. Unfortunately, his son passed away a few months ago and states overall coping fine.   Past Medical History:  Diagnosis Date   Anxiety    Phreesia 02/19/2021   Hyperlipidemia    Phreesia 02/19/2021   Hypertension    Phreesia 02/19/2021    Past Surgical History:  Procedure Laterality Date   TONSILLECTOMY  1975   VASECTOMY  1999    Family History  Problem Relation Age of Onset   Kidney cancer Mother    Diabetes Mother    Hypertension Mother    Alcoholism Father    Prostate cancer Father    Diabetes Father    Hypertension Father    COPD Father    SIDS Brother    Alcoholism Maternal Grandmother    Diabetes Maternal Grandmother    Alcoholism Maternal Grandfather    Diabetes Maternal Grandfather    Esophageal cancer Other    Colon cancer Neg Hx    Colon polyps Neg Hx    Rectal cancer Neg Hx    Stomach cancer Neg Hx     Social History   Socioeconomic History   Marital status: Married    Spouse name: Not on file   Number of children: Not on file   Years of education: Not on file   Highest education  level: Not on file  Occupational History   Not on file  Tobacco Use   Smoking status: Former    Packs/day: 1.50    Years: 15.00    Pack years: 22.50    Types: Cigarettes, Cigars    Quit date: 2006    Years since quitting: 16.8   Smokeless tobacco: Current    Types: Snuff   Tobacco comments:    still smokes cigars occ.   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   Drug use: Not Currently   Sexual activity: Yes    Partners: Female    Birth control/protection: None  Other Topics Concern   Not on file  Social History Narrative   Not on file   Social Determinants of Health   Financial Resource Strain: Not on file  Food Insecurity: Not on file  Transportation Needs: Not on file  Physical Activity: Not on file  Stress: Not on file  Social Connections: Not on file  Intimate Partner Violence: Not on file    Outpatient Medications Prior to Visit  Medication Sig Dispense Refill   busPIRone (BUSPAR) 10 MG tablet Take 1 tablet (10 mg total)  by mouth 3 (three) times daily. 270 tablet 0   cholecalciferol (VITAMIN D3) 25 MCG (1000 UNIT) tablet Take 1,000 Units by mouth daily.     Cinnamon 500 MG capsule Take 1,000 mg by mouth daily.     fluticasone (FLONASE) 50 MCG/ACT nasal spray PLACE 1 SPRAY INTO EACH NOSTRIL FOLLOWING SINUS RINSES TWICE A DAY. 16 g 1   glucosamine-chondroitin 500-400 MG tablet Take 1 tablet by mouth 3 (three) times daily.     Krill Oil 350 MG CAPS Take 1 capsule by mouth daily.     loratadine (CLARITIN) 10 MG tablet Take 10 mg by mouth daily.     Multiple Vitamin (ONE-A-DAY MENS PO) Take 1 tablet by mouth daily.     POTASSIUM PO Take 1 tablet by mouth daily.     sildenafil (REVATIO) 20 MG tablet TAKE 1 TABLET (20 MG TOTAL) BY MOUTH AS NEEDED. (Patient not taking: Reported on 07/18/2021) 20 tablet 0   UNABLE TO FIND Med Name: Hydroxycut daily weight loss medicine      No facility-administered medications prior to visit.    Allergies  Allergen Reactions   Nylon Rash    sutures   Other Itching, Rash and Swelling    ROS Review of Systems Review of Systems:  A fourteen system review of systems was performed and found to be positive as per HPI.   Objective:    Physical Exam General:  Pleasant and cooperative, in no acute distress  Neuro:  Alert and oriented,  extra-ocular muscles intact  HEENT:  Normocephalic, atraumatic, neck supple Skin:  no gross rash, warm, pink. Cardiac:  RRR, S1 S2 Respiratory:  CTA B/L w/o wheezing, crackles or rales. Vascular:  Ext warm, no cyanosis apprec.; cap RF less 2 sec. Psych:  No HI/SI, judgement and insight good, Euthymic mood. Full Affect.  BP 126/79   Pulse 76   Temp 98.1 F (36.7 C)   Ht _0  (1.854 m)   Wt 206 lb (93.4 kg)   SpO2 97%   BMI 27.18 kg/m  Wt Readings from Last 3 Encounters:  10/14/21 206 lb (93.4 kg)  07/18/21 218 lb (98.9 kg)  07/01/21 218 lb (98.9 kg)     Health Maintenance Due  Topic Date Due   COVID-19 Vaccine (4 - Booster for Pfizer series) 12/23/2020   INFLUENZA VACCINE  07/08/2021    There are no preventive care reminders to display for this patient.  Lab Results  Component Value Date   TSH 0.992 02/13/2021   Lab Results  Component Value Date   WBC 9.3 02/13/2021   HGB 15.6 02/13/2021   HCT 46.9 02/13/2021   MCV 84 02/13/2021   PLT 246 02/13/2021   Lab Results  Component Value Date   NA 140 10/04/2021   K 4.5 10/04/2021   CO2 24 10/04/2021   GLUCOSE 95 10/04/2021   BUN 14 10/04/2021   CREATININE 1.17 10/04/2021   BILITOT 0.6 10/04/2021   ALKPHOS 79 10/04/2021   AST 18 10/04/2021   ALT 22 10/04/2021   PROT 6.8 10/04/2021   ALBUMIN 4.6 10/04/2021   CALCIUM 9.6 10/04/2021   EGFR 75 10/04/2021   Lab Results  Component Value Date   CHOL 172 10/04/2021   Lab Results  Component Value Date   HDL 34 (L) 10/04/2021   Lab Results  Component Value  Date   LDLCALC 116 (H) 10/04/2021   Lab Results  Component Value Date   TRIG 120 10/04/2021   Lab  Results  Component Value Date   CHOLHDL 5.1 (H) 10/04/2021   Lab Results  Component Value Date   HGBA1C 5.5 02/13/2021      Assessment & Plan:   Problem List Items Addressed This Visit       Cardiovascular and Mediastinum   Essential hypertension - Primary     Other   Mixed hyperlipidemia   Adjustment disorder with mixed anxiety and depressed mood   Essential hypertension: -Stable and diet controlled, <130/80. -Will continue to monitor. -Discussed recent CMP which was normal including renal function.  Mixed hyperlipidemia: -Discussed most recent lipid panel, triglycerides have improved from 266 to 120, LDL decreased from 119 to 116, HDL mildly improved from 30 to 34.  -The 10-year ASCVD risk score (Arnett DK, et al., 2019) is: 4.9% -Discussed to continue weight loss efforts and low fat diet.  Adjustment disorder with mixed anxiety and depressed mood: -PHQ-9 score of 0. Stable. -Continue current medication regimen. -Will continue to monitor.     No orders of the defined types were placed in this encounter.   Follow-up: Return in about 4 months (around 02/21/2022) for CPE and FBW .    Lorrene Reid, PA-C

## 2021-10-31 IMAGING — MR MR CERVICAL SPINE W/O CM
4 of 5 series · 30 of 48 positions shown · non-contrast
Comparison: None.

CLINICAL DATA: Neck pain, chronic.  Cervical dystonia.

EXAM:
MRI CERVICAL SPINE WITHOUT CONTRAST
TECHNIQUE: Multiplanar, multisequence MR imaging of the cervical spine was
performed. No intravenous contrast was administered.

[Series 3: T2 · sagittal · 3.0mm · 0.66mm/px · 8 of 18 slices shown (1 of 2)]
[im 1/18]
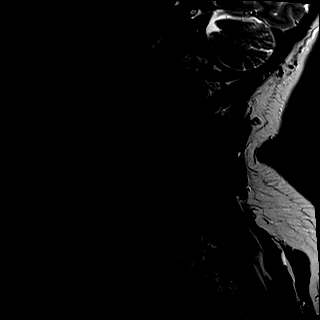
[im 3/18]
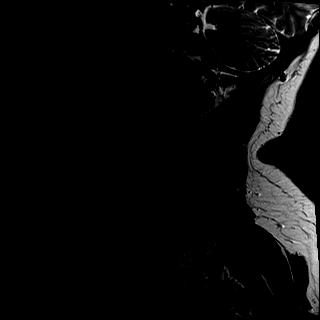
[im 5/18]
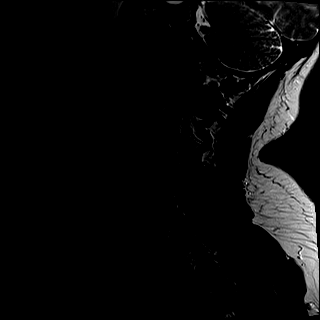
[im 8/18]
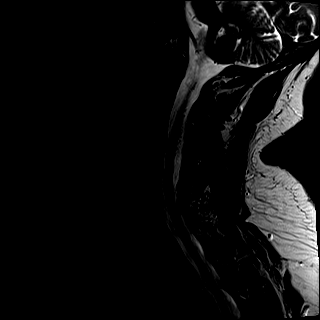
[im 10/18]
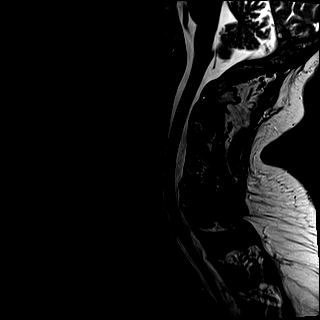
[im 13/18]
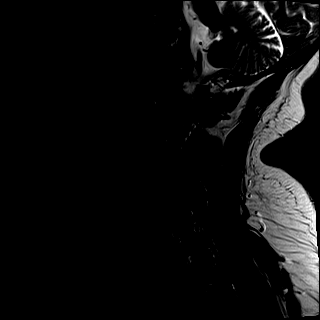
[im 15/18]
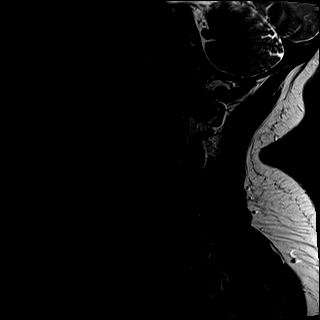
[im 18/18]
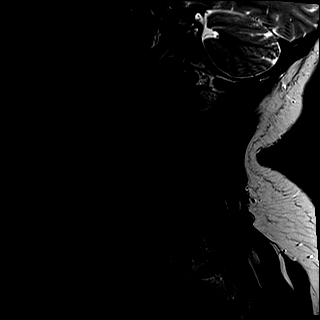

[Series 4: T1 · sagittal · 3.0mm · 0.41mm/px · 7 of 18 slices shown]
[im 1/18]
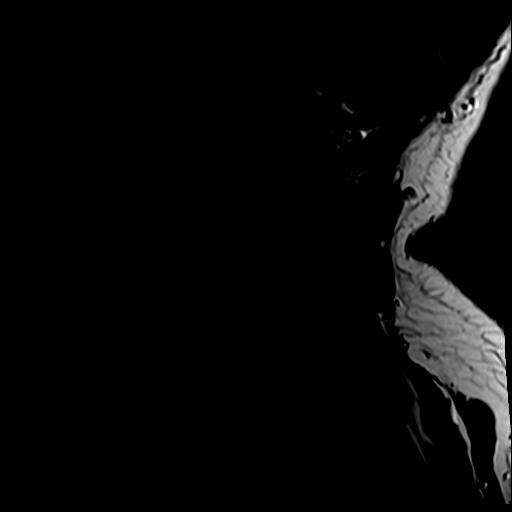
[im 3/18]
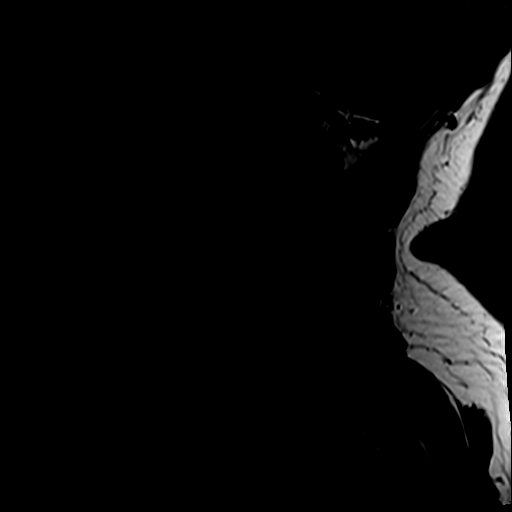
[im 6/18]
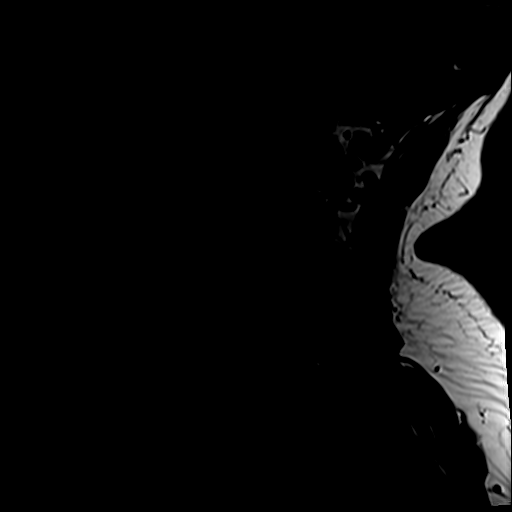
[im 9/18]
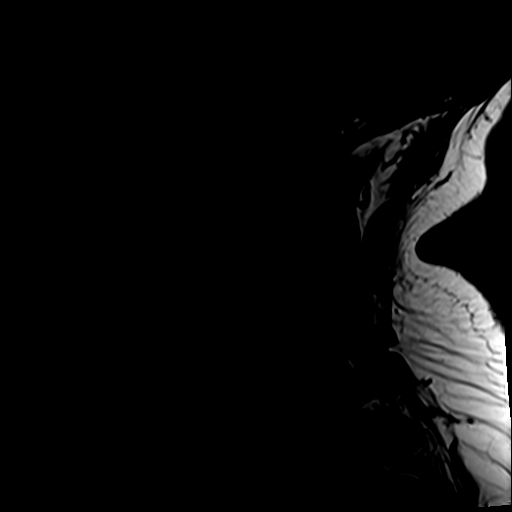
[im 12/18]
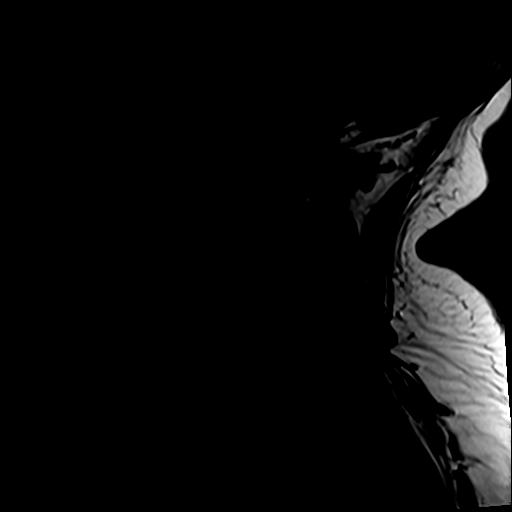
[im 15/18]
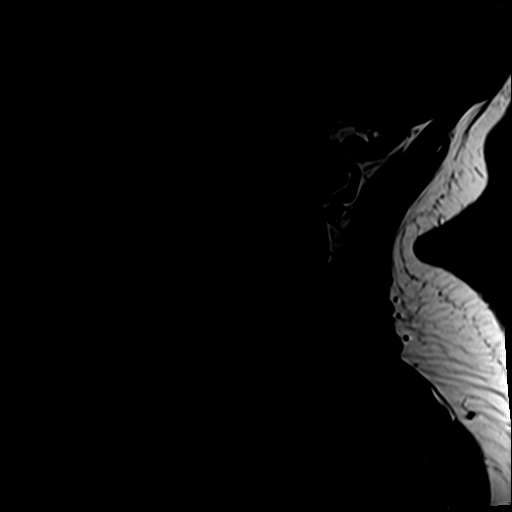
[im 18/18]
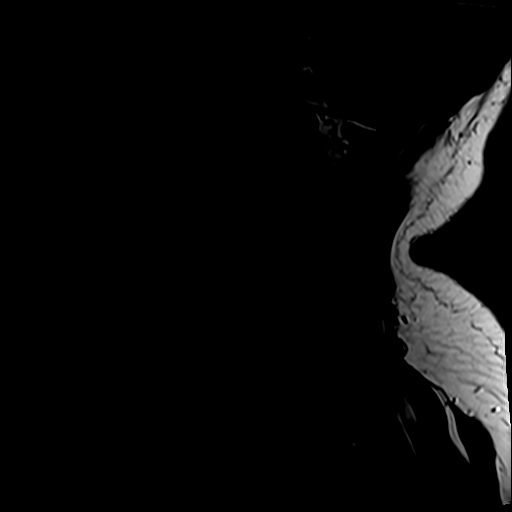

[Series 5: tir sag · sagittal · 3.0mm · 0.41mm/px · 6 of 18 slices shown]
[im 1/18]
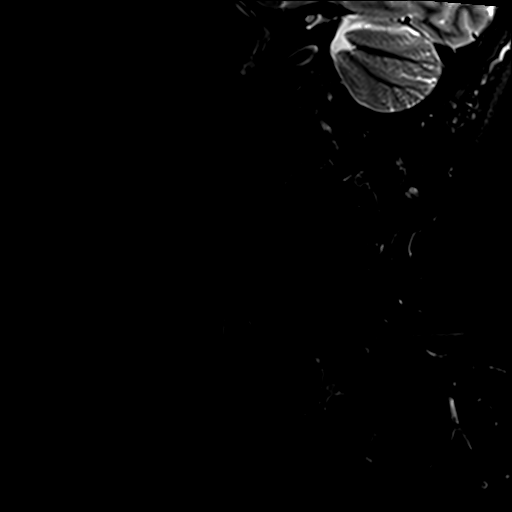
[im 3/18]
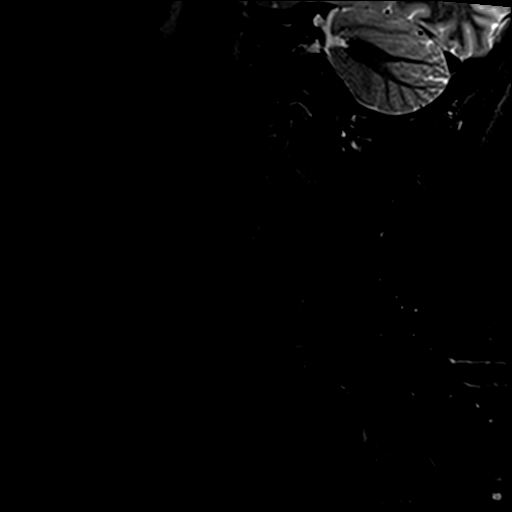
[im 6/18]
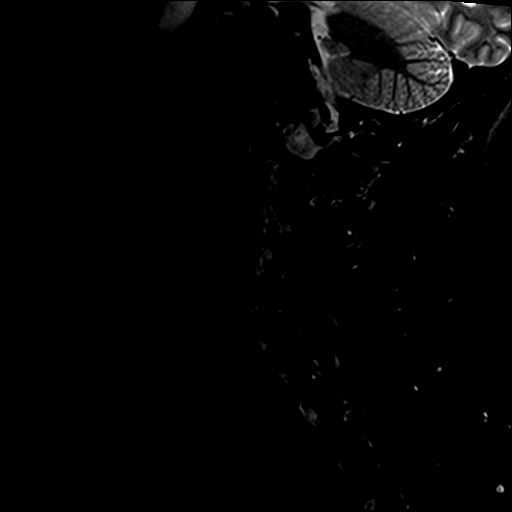
[im 9/18]
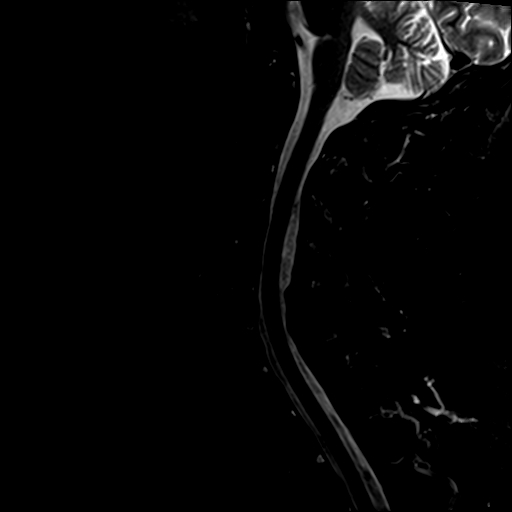
[im 12/18]
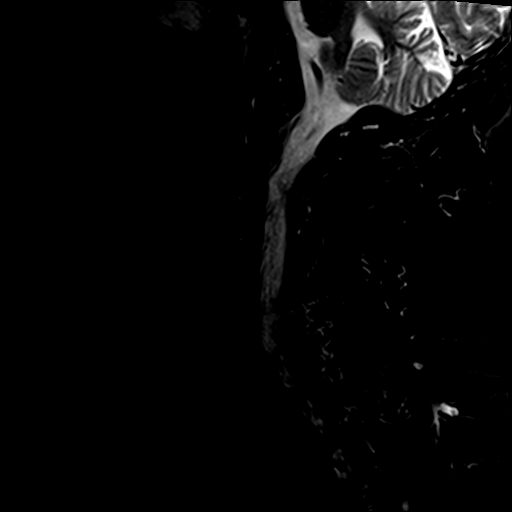
[im 15/18]
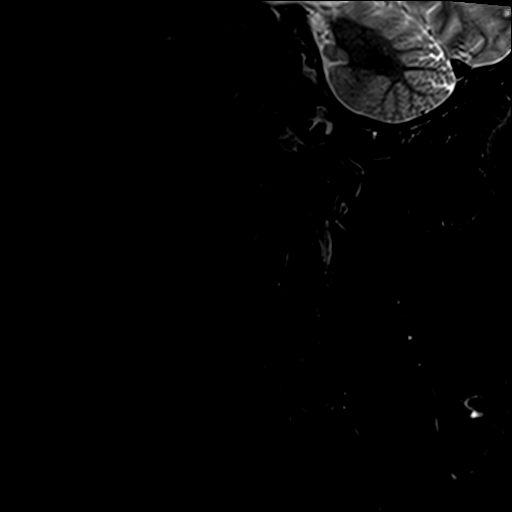

[Series 7: T2 · axial · 3.0mm · 0.70mm/px · z∈[-41,+73]mm · 9 of 32 slices shown (2 of 2)]
[im 1/32]
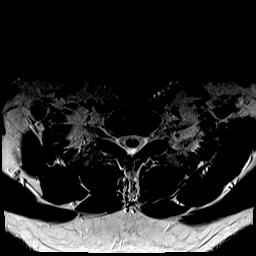
[im 6/32]
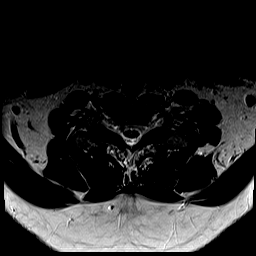
[im 11/32]
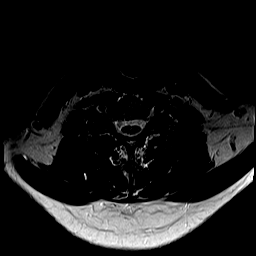
[im 13/32]
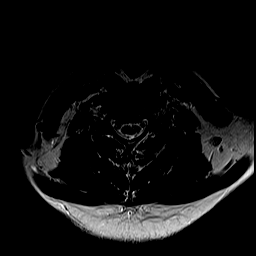
[im 16/32]
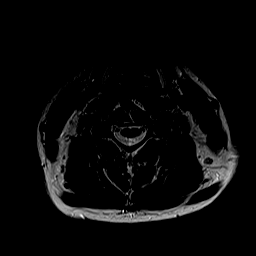
[im 19/32]
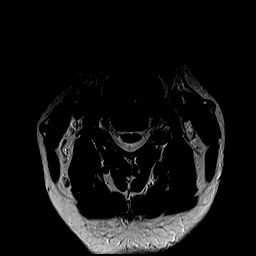
[im 21/32]
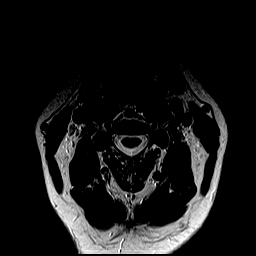
[im 26/32]
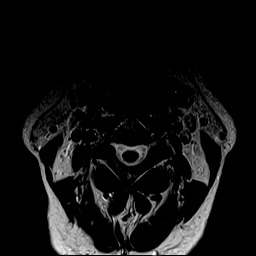
[im 32/32]
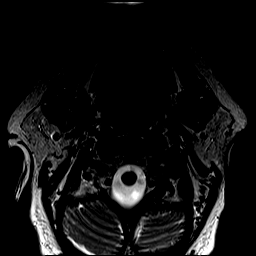

[30 of 48 positions shown; findings below may reference images not displayed]

FINDINGS: Alignment: Minimal anterolisthesis of C2 over C3.

Vertebrae: No fracture, evidence of discitis, or bone lesion.

Cord: Normal signal and morphology.

Posterior Fossa, vertebral arteries, paraspinal tissues: Negative.

Disc levels:

C2-3: No spinal canal or neural foraminal stenosis.

C3-4: No spinal canal or neural foraminal stenosis.

C4-5: No spinal canal or neural foraminal stenosis.

C5-6: Tiny posterior disc protrusion without significant spinal
canal stenosis. Uncovertebral degenerative changes resulting mild
right neural foraminal narrowing.

C6-7: Small posterior disc protrusion without significant spinal
canal stenosis. No significant neural foraminal narrowing.

C7-T1: No spinal canal or neural foraminal stenosis.
IMPRESSION: 1. Mild degenerative changes of the cervical spine at C5-6 and C6-7
with mild right neural foraminal narrowing at C5-6.
2. No significant spinal canal stenosis at any level.

## 2022-01-01 ENCOUNTER — Other Ambulatory Visit: Payer: Self-pay | Admitting: Physician Assistant

## 2022-02-04 ENCOUNTER — Other Ambulatory Visit: Payer: Self-pay | Admitting: Physician Assistant

## 2022-02-13 ENCOUNTER — Encounter: Payer: BC Managed Care – PPO | Admitting: Physician Assistant

## 2022-03-29 ENCOUNTER — Other Ambulatory Visit: Payer: Self-pay | Admitting: Physician Assistant

## 2022-05-03 ENCOUNTER — Other Ambulatory Visit: Payer: Self-pay | Admitting: Physician Assistant

## 2022-05-03 DIAGNOSIS — N529 Male erectile dysfunction, unspecified: Secondary | ICD-10-CM

## 2022-06-05 ENCOUNTER — Other Ambulatory Visit: Payer: Self-pay | Admitting: Physician Assistant
